# Patient Record
Sex: Female | Born: 1970 | Race: White | Hispanic: No | Marital: Married | Smoking: Never smoker
Health system: Southern US, Community
[De-identification: ages and names within clinical notes are randomized; demographics above are authoritative.]

## PROBLEM LIST (undated history)

## (undated) DIAGNOSIS — Z452 Encounter for adjustment and management of vascular access device: Secondary | ICD-10-CM

## (undated) HISTORY — DX: Encounter for adjustment and management of vascular access device: Z45.2

---

## 2014-06-28 HISTORY — PX: HEMILAMINOTOMY LUMBAR SPINE: SUR654

## 2014-07-13 DIAGNOSIS — M5126 Other intervertebral disc displacement, lumbar region: Secondary | ICD-10-CM

## 2014-07-13 HISTORY — DX: Other intervertebral disc displacement, lumbar region: M51.26

## 2015-06-04 DIAGNOSIS — R748 Abnormal levels of other serum enzymes: Secondary | ICD-10-CM

## 2015-06-04 DIAGNOSIS — E119 Type 2 diabetes mellitus without complications: Secondary | ICD-10-CM

## 2015-06-04 DIAGNOSIS — E1140 Type 2 diabetes mellitus with diabetic neuropathy, unspecified: Secondary | ICD-10-CM | POA: Insufficient documentation

## 2015-06-04 DIAGNOSIS — E1165 Type 2 diabetes mellitus with hyperglycemia: Secondary | ICD-10-CM | POA: Insufficient documentation

## 2015-06-04 DIAGNOSIS — IMO0002 Reserved for concepts with insufficient information to code with codable children: Secondary | ICD-10-CM

## 2015-06-04 HISTORY — DX: Type 2 diabetes mellitus without complications: E11.9

## 2015-06-04 HISTORY — DX: Abnormal levels of other serum enzymes: R74.8

## 2015-06-04 HISTORY — DX: Reserved for concepts with insufficient information to code with codable children: IMO0002

## 2015-06-12 DIAGNOSIS — R600 Localized edema: Secondary | ICD-10-CM

## 2015-06-12 DIAGNOSIS — E8809 Other disorders of plasma-protein metabolism, not elsewhere classified: Secondary | ICD-10-CM

## 2015-06-12 DIAGNOSIS — M7120 Synovial cyst of popliteal space [Baker], unspecified knee: Secondary | ICD-10-CM

## 2015-06-12 HISTORY — DX: Other disorders of plasma-protein metabolism, not elsewhere classified: E88.09

## 2015-06-12 HISTORY — DX: Synovial cyst of popliteal space (Baker), unspecified knee: M71.20

## 2015-06-12 HISTORY — DX: Localized edema: R60.0

## 2015-06-24 DIAGNOSIS — I480 Paroxysmal atrial fibrillation: Secondary | ICD-10-CM

## 2015-06-24 DIAGNOSIS — Z79899 Other long term (current) drug therapy: Secondary | ICD-10-CM

## 2015-06-24 HISTORY — DX: Other long term (current) drug therapy: Z79.899

## 2015-06-24 HISTORY — DX: Paroxysmal atrial fibrillation: I48.0

## 2015-12-20 DIAGNOSIS — A6920 Lyme disease, unspecified: Secondary | ICD-10-CM

## 2015-12-20 HISTORY — DX: Lyme disease, unspecified: A69.20

## 2016-02-18 DIAGNOSIS — M5441 Lumbago with sciatica, right side: Secondary | ICD-10-CM

## 2016-02-18 DIAGNOSIS — M4626 Osteomyelitis of vertebra, lumbar region: Secondary | ICD-10-CM

## 2016-02-18 DIAGNOSIS — M4620 Osteomyelitis of vertebra, site unspecified: Secondary | ICD-10-CM

## 2016-02-18 DIAGNOSIS — G8929 Other chronic pain: Secondary | ICD-10-CM

## 2016-02-18 DIAGNOSIS — M4800 Spinal stenosis, site unspecified: Secondary | ICD-10-CM

## 2016-02-18 DIAGNOSIS — M4646 Discitis, unspecified, lumbar region: Secondary | ICD-10-CM

## 2016-02-18 HISTORY — DX: Spinal stenosis, site unspecified: M48.00

## 2016-02-18 HISTORY — DX: Osteomyelitis of vertebra, lumbar region: M46.26

## 2016-02-18 HISTORY — DX: Discitis, unspecified, lumbar region: M46.46

## 2016-02-18 HISTORY — DX: Other chronic pain: G89.29

## 2016-02-18 HISTORY — DX: Osteomyelitis of vertebra, site unspecified: M46.20

## 2016-03-12 ENCOUNTER — Ambulatory Visit: Payer: Self-pay | Admitting: Infectious Diseases

## 2016-03-21 DIAGNOSIS — D649 Anemia, unspecified: Secondary | ICD-10-CM | POA: Insufficient documentation

## 2016-03-21 DIAGNOSIS — N179 Acute kidney failure, unspecified: Secondary | ICD-10-CM

## 2016-03-21 DIAGNOSIS — I10 Essential (primary) hypertension: Secondary | ICD-10-CM

## 2016-03-21 DIAGNOSIS — N189 Chronic kidney disease, unspecified: Secondary | ICD-10-CM

## 2016-03-21 DIAGNOSIS — K219 Gastro-esophageal reflux disease without esophagitis: Secondary | ICD-10-CM

## 2016-03-21 HISTORY — DX: Essential (primary) hypertension: I10

## 2016-03-21 HISTORY — DX: Gastro-esophageal reflux disease without esophagitis: K21.9

## 2016-03-21 HISTORY — DX: Acute kidney failure, unspecified: N18.9

## 2016-03-21 HISTORY — DX: Acute kidney failure, unspecified: N17.9

## 2016-03-21 HISTORY — DX: Anemia, unspecified: D64.9

## 2016-08-27 ENCOUNTER — Ambulatory Visit: Payer: Medicare Other | Admitting: Sports Medicine

## 2016-09-04 ENCOUNTER — Ambulatory Visit (INDEPENDENT_AMBULATORY_CARE_PROVIDER_SITE_OTHER): Payer: Medicare Other | Admitting: Sports Medicine

## 2016-09-04 ENCOUNTER — Encounter: Payer: Self-pay | Admitting: Sports Medicine

## 2016-09-04 DIAGNOSIS — M2042 Other hammer toe(s) (acquired), left foot: Secondary | ICD-10-CM | POA: Diagnosis not present

## 2016-09-04 DIAGNOSIS — F40240 Claustrophobia: Secondary | ICD-10-CM

## 2016-09-04 DIAGNOSIS — E1142 Type 2 diabetes mellitus with diabetic polyneuropathy: Secondary | ICD-10-CM

## 2016-09-04 DIAGNOSIS — D72829 Elevated white blood cell count, unspecified: Secondary | ICD-10-CM | POA: Insufficient documentation

## 2016-09-04 DIAGNOSIS — Z89422 Acquired absence of other left toe(s): Secondary | ICD-10-CM | POA: Diagnosis not present

## 2016-09-04 DIAGNOSIS — E11621 Type 2 diabetes mellitus with foot ulcer: Secondary | ICD-10-CM | POA: Diagnosis not present

## 2016-09-04 DIAGNOSIS — L97421 Non-pressure chronic ulcer of left heel and midfoot limited to breakdown of skin: Secondary | ICD-10-CM | POA: Diagnosis not present

## 2016-09-04 DIAGNOSIS — K219 Gastro-esophageal reflux disease without esophagitis: Secondary | ICD-10-CM

## 2016-09-04 DIAGNOSIS — IMO0002 Reserved for concepts with insufficient information to code with codable children: Secondary | ICD-10-CM

## 2016-09-04 DIAGNOSIS — M2041 Other hammer toe(s) (acquired), right foot: Secondary | ICD-10-CM

## 2016-09-04 DIAGNOSIS — M869 Osteomyelitis, unspecified: Secondary | ICD-10-CM | POA: Insufficient documentation

## 2016-09-04 HISTORY — DX: Claustrophobia: F40.240

## 2016-09-04 HISTORY — DX: Osteomyelitis, unspecified: M86.9

## 2016-09-04 HISTORY — DX: Gastro-esophageal reflux disease without esophagitis: K21.9

## 2016-09-04 HISTORY — DX: Elevated white blood cell count, unspecified: D72.829

## 2016-09-04 NOTE — Progress Notes (Signed)
Subjective: Emma Patton is a 45 y.o. female patient seen in office for Diabetic shoes and for evaluation of ulceration of the Left foot; Patient reports that she is going to the wound care center once a week where they aren't doing the dressing changes and local wound care to her left foot. States that tomorrow and make her 4th week. Patient has a history of diabetes and a blood glucose level  today of 200 mg/dl. Denies nausea/fever/vomiting/chills/night sweats/shortness of breath/pain. Patient has no other pedal complaints at this time.  Patient Active Problem List   Diagnosis Date Noted  . Claustrophobia 09/04/2016  . Esophageal reflux 09/04/2016  . Leukocytosis 09/04/2016  . Osteomyelitis of toe of left foot ([place]) 09/04/2016  . AKI (acute kidney injury) ([place]) 03/21/2016  . Anemia 03/21/2016  . Gastroesophageal reflux disease 03/21/2016  . HTN (hypertension), benign 03/21/2016  . Chronic bilateral low back pain with right-sided sciatica 02/18/2016  . Discitis of lumbar region 02/18/2016  . 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 Osteomyelitis of spine ([place]) 02/18/2016  . Spinal stenosis 02/18/2016  . Lyme disease 12/20/2015  . On amiodarone therapy 06/24/2015  . PAF (paroxysmal atrial fibrillation) ([place]) 06/24/2015  . Baker's cyst of knee 06/12/2015  . Bilateral leg edema 06/12/2015  . Edema of lower extremity 06/12/2015  . Hypoalbuminemia 06/12/2015  . Diabetes mellitus ([place]) 06/04/2015  . DM type 2, uncontrolled, with neuropathy ([place]) 06/04/2015  . Elevated alkaline phosphatase level 06/04/2015  . 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   No current facility-administered medications on file prior to visit.    Allergies  Allergen Reactions  . Penicillins Rash and Hives    No results found for this or any previous visit (from the past 2160 hour(s)).  Objective: There were no vitals filed for this  visit.  General: Patient is awake, alert, oriented x 3 and in no acute distress.  Dermatology: Skin is warm and dry bilateral with a Partial thickness ulceration present plantar aspect of fifth metatarsal at base near styloid process. Ulceration measures 0.3 cm x 0.3 cm x 0.1 cm. There is a keratotic border with a granular base. The ulceration does not probe to bone. There is no malodor, no active drainage, no erythema, no edema. No acute signs of infection.   Vascular: Dorsalis Pedis pulse = 2/4 Bilateral,  Posterior Tibial pulse = 1/4 Bilateral,  Capillary Fill Time < 5 seconds  Neurologic: Protective sensation absent to the level of the ankles using the 5.07/10g [employer]. Vibratory absent bilateral.  Musculosketal: Amputation status of the left fourth and fifth toes with slight fixed varus deformity of the left foot.  No Pain with palpation to ulcerated area. No pain with compression to calves bilateral.   Assessment and Plan:  Problem List Items Addressed This Visit    None    Visit Diagnoses    Diabetic ulcer of left midfoot associated with type 2 diabetes mellitus, limited to breakdown of skin ([place])    -  Primary   plantar styloid process   Relevant Medications   sitaGLIPtin (JANUVIA) 100 MG tablet   Diabetic polyneuropathy associated with type 2 diabetes mellitus (HCC)       Relevant Medications   gabapentin (NEURONTIN) 600 MG tablet   sitaGLIPtin (JANUVIA) 100 MG tablet   pregabalin (LYRICA) 75 MG capsule   traZODone (DESYREL) 100 MG tablet   zolpidem (AMBIEN) 10 MG tablet   Hammer toes  of both feet       Toe amputation status, left (HCC)       4-5 toes     -Examined patient and discussed the progression of the wound and treatment alternatives. -Patient to continue with local wound care with wound care center once a week -Xrays reviewedFrom [place]. Negative for osteomyelitis -Today, cleansed ulceration with wound wash and applied dry sterile dressing  and instructed patient to continue with wound care center Follow-up. - Advised patient to go to the ER or return to office if the wound worsens or if constitutional symptoms are present. -[employer] diabetic shoe order form was completed; foam box compression of both feet were obtained Recommend patient to get custom offloading inserts with lesser toe fillers patient is missing the left fourth and fifth toes: office to contact primary care for approval / certification;  Office to arrange shoe fitting and dispensing. -Patient to return to office for pick up diabetic shoes and evaluation or sooner if problems arise.  Landis Martins, DPM

## 2016-09-08 DIAGNOSIS — M5136 Other intervertebral disc degeneration, lumbar region: Secondary | ICD-10-CM

## 2016-09-08 DIAGNOSIS — M51369 Other intervertebral disc degeneration, lumbar region without mention of lumbar back pain or lower extremity pain: Secondary | ICD-10-CM

## 2016-09-08 HISTORY — DX: Other intervertebral disc degeneration, lumbar region: M51.36

## 2016-09-08 HISTORY — DX: Other intervertebral disc degeneration, lumbar region without mention of lumbar back pain or lower extremity pain: M51.369

## 2016-12-14 DIAGNOSIS — M86172 Other acute osteomyelitis, left ankle and foot: Secondary | ICD-10-CM

## 2016-12-14 DIAGNOSIS — E1351 Other specified diabetes mellitus with diabetic peripheral angiopathy without gangrene: Secondary | ICD-10-CM

## 2016-12-14 DIAGNOSIS — D509 Iron deficiency anemia, unspecified: Secondary | ICD-10-CM | POA: Diagnosis not present

## 2016-12-14 DIAGNOSIS — E1142 Type 2 diabetes mellitus with diabetic polyneuropathy: Secondary | ICD-10-CM | POA: Diagnosis not present

## 2016-12-14 DIAGNOSIS — L03116 Cellulitis of left lower limb: Secondary | ICD-10-CM

## 2016-12-15 DIAGNOSIS — L089 Local infection of the skin and subcutaneous tissue, unspecified: Secondary | ICD-10-CM | POA: Diagnosis not present

## 2016-12-15 DIAGNOSIS — E1142 Type 2 diabetes mellitus with diabetic polyneuropathy: Secondary | ICD-10-CM | POA: Diagnosis not present

## 2016-12-15 DIAGNOSIS — L0291 Cutaneous abscess, unspecified: Secondary | ICD-10-CM

## 2016-12-15 DIAGNOSIS — E1351 Other specified diabetes mellitus with diabetic peripheral angiopathy without gangrene: Secondary | ICD-10-CM | POA: Diagnosis not present

## 2016-12-15 DIAGNOSIS — M86172 Other acute osteomyelitis, left ankle and foot: Secondary | ICD-10-CM | POA: Diagnosis not present

## 2016-12-15 DIAGNOSIS — L03116 Cellulitis of left lower limb: Secondary | ICD-10-CM | POA: Diagnosis not present

## 2016-12-16 ENCOUNTER — Encounter: Payer: Self-pay | Admitting: Sports Medicine

## 2016-12-16 DIAGNOSIS — L03116 Cellulitis of left lower limb: Secondary | ICD-10-CM | POA: Diagnosis not present

## 2016-12-16 DIAGNOSIS — E1142 Type 2 diabetes mellitus with diabetic polyneuropathy: Secondary | ICD-10-CM | POA: Diagnosis not present

## 2016-12-16 DIAGNOSIS — M86172 Other acute osteomyelitis, left ankle and foot: Secondary | ICD-10-CM | POA: Diagnosis not present

## 2016-12-16 DIAGNOSIS — E1351 Other specified diabetes mellitus with diabetic peripheral angiopathy without gangrene: Secondary | ICD-10-CM | POA: Diagnosis not present

## 2016-12-17 DIAGNOSIS — N289 Disorder of kidney and ureter, unspecified: Secondary | ICD-10-CM

## 2016-12-17 DIAGNOSIS — L03116 Cellulitis of left lower limb: Secondary | ICD-10-CM

## 2016-12-17 DIAGNOSIS — E1351 Other specified diabetes mellitus with diabetic peripheral angiopathy without gangrene: Secondary | ICD-10-CM

## 2016-12-17 DIAGNOSIS — D509 Iron deficiency anemia, unspecified: Secondary | ICD-10-CM | POA: Diagnosis not present

## 2016-12-17 DIAGNOSIS — M86172 Other acute osteomyelitis, left ankle and foot: Secondary | ICD-10-CM

## 2016-12-17 DIAGNOSIS — I10 Essential (primary) hypertension: Secondary | ICD-10-CM | POA: Diagnosis not present

## 2016-12-17 DIAGNOSIS — E1142 Type 2 diabetes mellitus with diabetic polyneuropathy: Secondary | ICD-10-CM | POA: Diagnosis not present

## 2016-12-17 DIAGNOSIS — L0291 Cutaneous abscess, unspecified: Secondary | ICD-10-CM

## 2016-12-18 DIAGNOSIS — I10 Essential (primary) hypertension: Secondary | ICD-10-CM | POA: Diagnosis not present

## 2016-12-18 DIAGNOSIS — L0291 Cutaneous abscess, unspecified: Secondary | ICD-10-CM | POA: Diagnosis not present

## 2016-12-18 DIAGNOSIS — M86172 Other acute osteomyelitis, left ankle and foot: Secondary | ICD-10-CM | POA: Diagnosis not present

## 2016-12-18 DIAGNOSIS — N289 Disorder of kidney and ureter, unspecified: Secondary | ICD-10-CM | POA: Diagnosis not present

## 2016-12-18 DIAGNOSIS — L03116 Cellulitis of left lower limb: Secondary | ICD-10-CM | POA: Diagnosis not present

## 2016-12-19 DIAGNOSIS — N289 Disorder of kidney and ureter, unspecified: Secondary | ICD-10-CM | POA: Diagnosis not present

## 2016-12-19 DIAGNOSIS — I10 Essential (primary) hypertension: Secondary | ICD-10-CM | POA: Diagnosis not present

## 2016-12-19 DIAGNOSIS — M86172 Other acute osteomyelitis, left ankle and foot: Secondary | ICD-10-CM | POA: Diagnosis not present

## 2016-12-19 DIAGNOSIS — L03116 Cellulitis of left lower limb: Secondary | ICD-10-CM | POA: Diagnosis not present

## 2016-12-30 ENCOUNTER — Encounter: Payer: Self-pay | Admitting: Sports Medicine

## 2017-01-01 ENCOUNTER — Encounter: Payer: Self-pay | Admitting: Sports Medicine

## 2017-01-01 ENCOUNTER — Ambulatory Visit (INDEPENDENT_AMBULATORY_CARE_PROVIDER_SITE_OTHER): Payer: Self-pay | Admitting: Sports Medicine

## 2017-01-01 VITALS — BP 154/71 | HR 65 | Temp 97.3°F | Resp 18

## 2017-01-01 DIAGNOSIS — E1142 Type 2 diabetes mellitus with diabetic polyneuropathy: Secondary | ICD-10-CM

## 2017-01-01 DIAGNOSIS — Z9889 Other specified postprocedural states: Secondary | ICD-10-CM

## 2017-01-01 DIAGNOSIS — E11621 Type 2 diabetes mellitus with foot ulcer: Secondary | ICD-10-CM

## 2017-01-01 DIAGNOSIS — IMO0002 Reserved for concepts with insufficient information to code with codable children: Secondary | ICD-10-CM

## 2017-01-01 DIAGNOSIS — Z89422 Acquired absence of other left toe(s): Secondary | ICD-10-CM

## 2017-01-01 DIAGNOSIS — L97421 Non-pressure chronic ulcer of left heel and midfoot limited to breakdown of skin: Secondary | ICD-10-CM

## 2017-01-01 NOTE — Progress Notes (Signed)
Subjective: Emma Patton is a 46 y.o. female patient seen today in office for POV #1 (DOS 12/15/2016), S/P left foot incision and drainage with removal of bone at amputation stump site. Patient reports that she is getting PICC line antibiotics every day and has several weeks left also reports that she is going to the wound care center and they have been applying Aqua-Seal AG to the wound. Patient denies pain at surgical site, denies calf pain, denies headache, chest pain, shortness of breath, nausea, vomiting, fever, or chills. Patient states that she is feeling better and at the wound center. They may try hyperbaric to help with healing. No other issues noted.   Patient Active Problem List   Diagnosis Date Noted  . Claustrophobia 09/04/2016  . Esophageal reflux 09/04/2016  . Leukocytosis 09/04/2016  . Osteomyelitis of toe of left foot ([place]) 09/04/2016  . AKI (acute kidney injury) ([place]) 03/21/2016  . Anemia 03/21/2016  . Gastroesophageal reflux disease 03/21/2016  . HTN (hypertension), benign 03/21/2016  . Chronic bilateral low back pain with right-sided sciatica 02/18/2016  . Discitis of lumbar region 02/18/2016  . 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 Osteomyelitis of spine ([place]) 02/18/2016  . Spinal stenosis 02/18/2016  . Lyme disease 12/20/2015  . On amiodarone therapy 06/24/2015  . PAF (paroxysmal atrial fibrillation) ([place]) 06/24/2015  . Baker's cyst of knee 06/12/2015  . Bilateral leg edema 06/12/2015  . Edema of lower extremity 06/12/2015  . Hypoalbuminemia 06/12/2015  . Diabetes mellitus ([place]) 06/04/2015  . DM type 2, uncontrolled, with neuropathy ([place]) 06/04/2015  . Elevated alkaline phosphatase level 06/04/2015  . Lumbar disc herniation 07/13/2014    Current Outpatient Prescriptions on File Prior to Visit  Medication Sig Dispense Refill  . amLODipine (NORVASC) 5 MG tablet Take 5 mg by mouth daily.    . furosemide (LASIX) 40 MG tablet Take 40 mg by  mouth.    . gabapentin (NEURONTIN) 600 MG tablet Take 600 mg by mouth.    . linaclotide (LINZESS) 145 MCG CAPS capsule Take by mouth.    . metoCLOPramide (REGLAN) 10 MG tablet Take 10 mg by mouth.    Marland Kitchen omeprazole (PRILOSEC) 20 MG capsule Take 20 mg by mouth.    . oxyCODONE-acetaminophen (PERCOCET) 10-325 MG tablet     . pregabalin (LYRICA) 75 MG capsule Take 75 mg by mouth.    . sitaGLIPtin (JANUVIA) 100 MG tablet Take 100 mg by mouth.    . traZODone (DESYREL) 100 MG tablet Take 100 mg by mouth.    . zolpidem (AMBIEN) 10 MG tablet Take 10 mg by mouth.     No current facility-administered medications on file prior to visit.     Allergies  Allergen Reactions  . Penicillins Rash and Hives    Objective: There were no vitals filed for this visit.  General: No acute distress, AAOx3  Left foot: Sutures intact with no gapping or dehiscence at surgical site, Full thickness ulcer, plantar lateral left foot measuring less than 2 cm and a partial thickness ulcer, dorsal lateral left foot measuring less than 1 cm with surrounding superficial blistering that is improving in nature that extends to the suture line on the dorsal lateral aspect of the left foot, no erythema, no warmth, no active drainage, no signs of infection noted, Capillary fill time <3 seconds in remaining all digits, gross sensation present via light touch to eft foot. No pain or crepitation with range of motion left foot.  Varus foot deformity status  post toe amputation previously left foot. No pain with calf compression.    Assessment and Plan:  Problem List Items Addressed This Visit    None    Visit Diagnoses    S/P foot surgery, left    -  Primary   Diabetic ulcer of left midfoot associated with type 2 diabetes mellitus, limited to breakdown of skin ([place])       Diabetic polyneuropathy associated with type 2 diabetes mellitus ([place])       Toe amputation status, left ([place])           -Patient seen and evaluated -Applied  dry sterile dressing to surgical site left foot secured with ACE wrap and stockinet  -Advised patient to make sure to keep dressings clean, dry, and intact to leftFoot with weekly wound care visits to wound care center -Advised patient to continue with post-op shoe on left foot -Continue PICC line antibiotics until completed -Advised patient to limit activity to necessity  -Advised patient to ice and elevate as necessary  -Will plan for suture removal at next office visit. In the meantime, patient to call office if any issues or problems arise.   Landis Martins, DPM

## 2017-01-07 ENCOUNTER — Ambulatory Visit (INDEPENDENT_AMBULATORY_CARE_PROVIDER_SITE_OTHER): Payer: Self-pay | Admitting: Sports Medicine

## 2017-01-07 ENCOUNTER — Encounter: Payer: Self-pay | Admitting: Sports Medicine

## 2017-01-07 DIAGNOSIS — L97421 Non-pressure chronic ulcer of left heel and midfoot limited to breakdown of skin: Secondary | ICD-10-CM

## 2017-01-07 DIAGNOSIS — E1142 Type 2 diabetes mellitus with diabetic polyneuropathy: Secondary | ICD-10-CM

## 2017-01-07 DIAGNOSIS — Z89422 Acquired absence of other left toe(s): Secondary | ICD-10-CM

## 2017-01-07 DIAGNOSIS — IMO0002 Reserved for concepts with insufficient information to code with codable children: Secondary | ICD-10-CM

## 2017-01-07 DIAGNOSIS — E11621 Type 2 diabetes mellitus with foot ulcer: Secondary | ICD-10-CM

## 2017-01-07 DIAGNOSIS — Z9889 Other specified postprocedural states: Secondary | ICD-10-CM

## 2017-01-07 NOTE — Progress Notes (Signed)
Subjective: Emma Patton is a 46 y.o. female patient seen today in office for POV #2 (DOS 12/15/2016), S/P left foot incision and drainage with removal of bone at amputation stump site. Patient reports that she is getting PICC line antibiotics every day and will finish Jan 31, 2017. Reports that she is going to the wound care center and they have been applying Aqua-Seal AG to the wound and may put her in Hyperbaric chamber. Admit had CT with possible osteomyelitis. Patient denies pain at surgical site, denies calf pain, denies headache, chest pain, shortness of breath, nausea, vomiting, fever, or chills. No other issues noted.   Patient Active Problem List   Diagnosis Date Noted  . Claustrophobia 09/04/2016  . Esophageal reflux 09/04/2016  . Leukocytosis 09/04/2016  . Osteomyelitis of toe of left foot ([place]) 09/04/2016  . AKI (acute kidney injury) ([place]) 03/21/2016  . Anemia 03/21/2016  . Gastroesophageal reflux disease 03/21/2016  . HTN (hypertension), benign 03/21/2016  . Chronic bilateral low back pain with right-sided sciatica 02/18/2016  . Discitis of lumbar region 02/18/2016  . 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 Osteomyelitis of spine ([place]) 02/18/2016  . Spinal stenosis 02/18/2016  . Lyme disease 12/20/2015  . On amiodarone therapy 06/24/2015  . PAF (paroxysmal atrial fibrillation) ([place]) 06/24/2015  . Baker's cyst of knee 06/12/2015  . Bilateral leg edema 06/12/2015  . Edema of lower extremity 06/12/2015  . Hypoalbuminemia 06/12/2015  . Diabetes mellitus ([place]) 06/04/2015  . DM type 2, uncontrolled, with neuropathy ([place]) 06/04/2015  . Elevated alkaline phosphatase level 06/04/2015  . Lumbar disc herniation 07/13/2014    Current Outpatient Prescriptions on File Prior to Visit  Medication Sig Dispense Refill  . amLODipine (NORVASC) 5 MG tablet Take 5 mg by mouth daily.    . furosemide (LASIX) 40 MG tablet Take 40 mg by mouth.    . gabapentin (NEURONTIN) 600  MG tablet Take 600 mg by mouth.    . linaclotide (LINZESS) 145 MCG CAPS capsule Take by mouth.    . metoCLOPramide (REGLAN) 10 MG tablet Take 10 mg by mouth.    Marland Kitchen omeprazole (PRILOSEC) 20 MG capsule Take 20 mg by mouth.    . oxyCODONE-acetaminophen (PERCOCET) 10-325 MG tablet     . pregabalin (LYRICA) 75 MG capsule Take 75 mg by mouth.    . sitaGLIPtin (JANUVIA) 100 MG tablet Take 100 mg by mouth.    . traZODone (DESYREL) 100 MG tablet Take 100 mg by mouth.    . zolpidem (AMBIEN) 10 MG tablet Take 10 mg by mouth.     No current facility-administered medications on file prior to visit.     Allergies  Allergen Reactions  . Penicillins Rash and Hives    Objective: There were no vitals filed for this visit.  General: No acute distress, AAOx3  Left foot: Sutures intact with no gapping or dehiscence at surgical site, Full thickness ulcer, plantar lateral left foot measuring less than 2 cm and a partial thickness ulcer, dorsal lateral left foot measuring less than 1 cm with surrounding superficial blistering that is improving in nature that extends to the suture line on the dorsal lateral aspect of the left foot, no erythema, no warmth, no active drainage, no signs of infection noted, Capillary fill time <3 seconds in remaining all digits, gross sensation present via light touch to eft foot. No pain or crepitation with range of motion left foot.  Varus foot deformity status post toe amputation previously left foot. No  pain with calf compression.    Assessment and Plan:  Problem List Items Addressed This Visit    None    Visit Diagnoses    S/P foot surgery, left    -  Primary   Diabetic ulcer of left midfoot associated with type 2 diabetes mellitus, limited to breakdown of skin ([place])       Diabetic polyneuropathy associated with type 2 diabetes mellitus ([place])       Toe amputation status, left ([place])           -Patient seen and evaluated -Sutures removed -Applied dry sterile dressing to  surgical site left foot secured with ACE wrap and stockinet  -Advised patient to make sure to keep dressings clean, dry, and intact to left foot with weekly wound care visits to wound care center -Advised patient to continue with post-op shoe on left foot -Continue PICC line antibiotics until completed (Jan 31, 2017) -Advised patient to limit activity to necessity  -Advised patient to ice and elevate as necessary  -Will plan for final wound check and deferring patient for continued wound care by wound center. In the meantime, patient to call office if any issues or problems arise.   Landis Martins, DPM

## 2017-01-21 ENCOUNTER — Ambulatory Visit (INDEPENDENT_AMBULATORY_CARE_PROVIDER_SITE_OTHER): Payer: Medicare Other | Admitting: Sports Medicine

## 2017-01-21 ENCOUNTER — Encounter: Payer: Self-pay | Admitting: Sports Medicine

## 2017-01-21 DIAGNOSIS — E1142 Type 2 diabetes mellitus with diabetic polyneuropathy: Secondary | ICD-10-CM

## 2017-01-21 DIAGNOSIS — M2041 Other hammer toe(s) (acquired), right foot: Secondary | ICD-10-CM

## 2017-01-21 DIAGNOSIS — Z9889 Other specified postprocedural states: Secondary | ICD-10-CM

## 2017-01-21 DIAGNOSIS — Z89422 Acquired absence of other left toe(s): Secondary | ICD-10-CM

## 2017-01-21 DIAGNOSIS — E11621 Type 2 diabetes mellitus with foot ulcer: Secondary | ICD-10-CM

## 2017-01-21 DIAGNOSIS — L97421 Non-pressure chronic ulcer of left heel and midfoot limited to breakdown of skin: Secondary | ICD-10-CM

## 2017-01-21 DIAGNOSIS — IMO0002 Reserved for concepts with insufficient information to code with codable children: Secondary | ICD-10-CM

## 2017-01-21 DIAGNOSIS — M2042 Other hammer toe(s) (acquired), left foot: Secondary | ICD-10-CM

## 2017-01-21 NOTE — Progress Notes (Signed)
Subjective: Emma Patton is a 46 y.o. female patient seen today in office for POV #3 (DOS 12/15/2016), S/P left foot incision and drainage with removal of bone at amputation stump site. Patient reports that she is getting PICC line antibiotics every day and will finish Jan 31, 2017. Reports that she is going to the wound care center and they have been applying Aqua-Seal AG to the wound; with appt today. Patient denies pain at surgical site, denies calf pain, denies headache, chest pain, shortness of breath, nausea, vomiting, fever, or chills. No other issues noted.   Patient Active Problem List   Diagnosis Date Noted  . Claustrophobia 09/04/2016  . Esophageal reflux 09/04/2016  . Leukocytosis 09/04/2016  . Osteomyelitis of toe of left foot ([place]) 09/04/2016  . AKI (acute kidney injury) ([place]) 03/21/2016  . Anemia 03/21/2016  . Gastroesophageal reflux disease 03/21/2016  . HTN (hypertension), benign 03/21/2016  . Chronic bilateral low back pain with right-sided sciatica 02/18/2016  . Discitis of lumbar region 02/18/2016  . 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 Osteomyelitis of spine ([place]) 02/18/2016  . Spinal stenosis 02/18/2016  . Lyme disease 12/20/2015  . On amiodarone therapy 06/24/2015  . PAF (paroxysmal atrial fibrillation) ([place]) 06/24/2015  . Baker's cyst of knee 06/12/2015  . Bilateral leg edema 06/12/2015  . Edema of lower extremity 06/12/2015  . Hypoalbuminemia 06/12/2015  . Diabetes mellitus ([place]) 06/04/2015  . DM type 2, uncontrolled, with neuropathy ([place]) 06/04/2015  . Elevated alkaline phosphatase level 06/04/2015  . Lumbar disc herniation 07/13/2014    Current Outpatient Prescriptions on File Prior to Visit  Medication Sig Dispense Refill  . amLODipine (NORVASC) 5 MG tablet Take 5 mg by mouth daily.    . furosemide (LASIX) 40 MG tablet Take 40 mg by mouth.    . gabapentin (NEURONTIN) 600 MG tablet Take 600 mg by mouth.    . linaclotide (LINZESS) 145  MCG CAPS capsule Take by mouth.    . metoCLOPramide (REGLAN) 10 MG tablet Take 10 mg by mouth.    Marland Kitchen omeprazole (PRILOSEC) 20 MG capsule Take 20 mg by mouth.    . oxyCODONE-acetaminophen (PERCOCET) 10-325 MG tablet     . pregabalin (LYRICA) 75 MG capsule Take 75 mg by mouth.    . sitaGLIPtin (JANUVIA) 100 MG tablet Take 100 mg by mouth.    . traZODone (DESYREL) 100 MG tablet Take 100 mg by mouth.    . zolpidem (AMBIEN) 10 MG tablet Take 10 mg by mouth.     No current facility-administered medications on file prior to visit.     Allergies  Allergen Reactions  . Penicillins Rash and Hives    Objective: There were no vitals filed for this visit.  General: No acute distress, AAOx3  Left foot: There is a now partial thickness ulcer, plantar lateral left foot measuring 1x0.4cm and a partial thickness ulcer, dorsal lateral left foot measuring less than 0.3x0.2cm with granular bases and no erythema, no warmth, no active drainage, no signs of infection noted, Capillary fill time <3 seconds in remaining all digits, gross sensation present via light touch to eft foot. No pain or crepitation with range of motion left foot.  Varus foot deformity status post toe amputation previously left foot. No pain with calf compression.    Assessment and Plan:  Problem List Items Addressed This Visit    None    Visit Diagnoses    S/P foot surgery, left    -  Primary  Diabetic ulcer of left midfoot associated with type 2 diabetes mellitus, limited to breakdown of skin (HCC)       Diabetic polyneuropathy associated with type 2 diabetes mellitus (HCC)       Toe amputation status, left (HCC)       Hammer toes of both feet           -Patient seen and evaluated -Applied dry sterile dressing to surgical site left foot secured with [place] and stockinet  -Advised patient to make sure to keep dressings clean, dry, and intact to left foot with weekly wound care visits to wound care center -Advised patient to  continue with post-op shoe on left foot -Continue PICC line antibiotics until completed (Jan 31, 2017) -Advised patient to limit activity to necessity  -Advised patient to ice and elevate as necessary  -Will plan for final wound check and deferring patient for continued wound care by wound center in 1 month. Also resubmitted Diabetic shoe paperwork for patient to get Dr. Jannette Fogo not NP to sign it since her last paperwork from December has expired. In the meantime, patient to call office if any issues or problems arise.   Landis Martins, DPM

## 2017-01-29 ENCOUNTER — Telehealth: Payer: Self-pay | Admitting: *Deleted

## 2017-01-29 NOTE — Telephone Encounter (Addendum)
Emma Patton - [facility] states pt's last dose of IV Vancomycin is tomorrow, and she would like to know if PICC was to remain in place another week or to be pulled. 01/30/2017-I informed Edd Fabian [facility] of Dr. Leeanne Rio 01/29/2017 3:44pm orders for PICC line. 02/05/2017-Gayle March [facility] states pt was to see Wound Care, but they released her last week, foot is healed and she asked if she was to remove the PICC line. 02/06/2017-I informed Emma Patton - [facility] of Dr. Leeanne Rio 02/05/2017 5:53pm orders to remove PICC line and remind pt to keep her 02/20/2017 appt. Edd Fabian states understanding.

## 2017-01-29 NOTE — Telephone Encounter (Signed)
The PICC line can be removed next week. Have patient to continue with her appointments with wound care center and if the wound is continuing to look good can remove next week. Thanks Dr. Cannon Kettle

## 2017-02-05 NOTE — Telephone Encounter (Signed)
Nursing can remove the PICC line. Patient can keep her appt for 5-25 for a final diabetic foot check Dr. Cannon Kettle

## 2017-02-20 ENCOUNTER — Encounter: Payer: Medicare Other | Admitting: Sports Medicine

## 2017-03-06 ENCOUNTER — Ambulatory Visit (INDEPENDENT_AMBULATORY_CARE_PROVIDER_SITE_OTHER): Payer: Self-pay | Admitting: Sports Medicine

## 2017-03-06 DIAGNOSIS — IMO0002 Reserved for concepts with insufficient information to code with codable children: Secondary | ICD-10-CM

## 2017-03-06 DIAGNOSIS — Z9889 Other specified postprocedural states: Secondary | ICD-10-CM

## 2017-03-06 DIAGNOSIS — E1142 Type 2 diabetes mellitus with diabetic polyneuropathy: Secondary | ICD-10-CM

## 2017-03-06 DIAGNOSIS — L97421 Non-pressure chronic ulcer of left heel and midfoot limited to breakdown of skin: Secondary | ICD-10-CM

## 2017-03-06 DIAGNOSIS — Z89422 Acquired absence of other left toe(s): Secondary | ICD-10-CM

## 2017-03-06 DIAGNOSIS — E11621 Type 2 diabetes mellitus with foot ulcer: Secondary | ICD-10-CM

## 2017-03-06 NOTE — Progress Notes (Signed)
Subjective: Emma Patton is a 46 y.o. female patient seen today in office for POV #4 (DOS 12/15/2016), S/P left foot incision and drainage with removal of bone at amputation stump site. Patient reports that she is doing well. Areas are healed and is in normal tennis shoe. Patient denies pain at  healed surgical site, denies calf pain, denies headache, chest pain, shortness of breath, nausea, vomiting, fever, or chills. No other issues noted.   Patient Active Problem List   Diagnosis Date Noted  . Claustrophobia 09/04/2016  . Esophageal reflux 09/04/2016  . Leukocytosis 09/04/2016  . Osteomyelitis of toe of left foot ([place]) 09/04/2016  . AKI (acute kidney injury) ([place]) 03/21/2016  . Anemia 03/21/2016  . Gastroesophageal reflux disease 03/21/2016  . HTN (hypertension), benign 03/21/2016  . Chronic bilateral low back pain with right-sided sciatica 02/18/2016  . Discitis of lumbar region 02/18/2016  . 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 Osteomyelitis of spine ([place]) 02/18/2016  . Spinal stenosis 02/18/2016  . Lyme disease 12/20/2015  . On amiodarone therapy 06/24/2015  . PAF (paroxysmal atrial fibrillation) ([place]) 06/24/2015  . Baker's cyst of knee 06/12/2015  . Bilateral leg edema 06/12/2015  . Edema of lower extremity 06/12/2015  . Hypoalbuminemia 06/12/2015  . Diabetes mellitus ([place]) 06/04/2015  . DM type 2, uncontrolled, with neuropathy ([place]) 06/04/2015  . Elevated alkaline phosphatase level 06/04/2015  . Lumbar disc herniation 07/13/2014    Current Outpatient Prescriptions on File Prior to Visit  Medication Sig Dispense Refill  . amLODipine (NORVASC) 5 MG tablet Take 5 mg by mouth daily.    . furosemide (LASIX) 40 MG tablet Take 40 mg by mouth.    . gabapentin (NEURONTIN) 600 MG tablet Take 600 mg by mouth.    . linaclotide (LINZESS) 145 MCG CAPS capsule Take by mouth.    . metoCLOPramide (REGLAN) 10 MG tablet Take 10 mg by mouth.    Marland Kitchen omeprazole (PRILOSEC)  20 MG capsule Take 20 mg by mouth.    . oxyCODONE-acetaminophen (PERCOCET) 10-325 MG tablet     . pregabalin (LYRICA) 75 MG capsule Take 75 mg by mouth.    . sitaGLIPtin (JANUVIA) 100 MG tablet Take 100 mg by mouth.    . traZODone (DESYREL) 100 MG tablet Take 100 mg by mouth.    . zolpidem (AMBIEN) 10 MG tablet Take 10 mg by mouth.     No current facility-administered medications on file prior to visit.     Allergies  Allergen Reactions  . Penicillins Rash and Hives    Objective: There were no vitals filed for this visit.  General: No acute distress, AAOx3  Left foot: Ulcerations healed, no erythema, no warmth, no active drainage, no signs of infection noted, Capillary fill time <3 seconds in remaining all digits, gross sensation present via light touch to eft foot. No pain or crepitation with range of motion left foot.  Varus foot deformity status post toe amputation previously on left foot. No pain with calf compression.    Assessment and Plan:  Problem List Items Addressed This Visit    None    Visit Diagnoses    S/P foot surgery, left    -  Primary   Diabetic ulcer of left midfoot associated with type 2 diabetes mellitus, limited to breakdown of skin ([place])       healed   Diabetic polyneuropathy associated with type 2 diabetes mellitus ([place])       Toe amputation status, left ([place])           -  Patient seen and evaluated -Area well healed -Patient awaiting diabetic shoes; patient to return on Wednesday for Diabetic shoe fitting.   Landis Martins, DPM

## 2017-03-11 ENCOUNTER — Other Ambulatory Visit: Payer: Medicare Other

## 2017-03-27 ENCOUNTER — Ambulatory Visit: Payer: Medicare Other

## 2017-04-03 ENCOUNTER — Ambulatory Visit: Payer: Medicare Other | Admitting: *Deleted

## 2017-04-17 ENCOUNTER — Other Ambulatory Visit: Payer: Medicare Other

## 2017-05-07 ENCOUNTER — Ambulatory Visit (INDEPENDENT_AMBULATORY_CARE_PROVIDER_SITE_OTHER): Payer: Medicare Other | Admitting: Sports Medicine

## 2017-05-07 DIAGNOSIS — E1142 Type 2 diabetes mellitus with diabetic polyneuropathy: Secondary | ICD-10-CM

## 2017-05-07 DIAGNOSIS — IMO0002 Reserved for concepts with insufficient information to code with codable children: Secondary | ICD-10-CM

## 2017-05-07 DIAGNOSIS — Z89422 Acquired absence of other left toe(s): Secondary | ICD-10-CM

## 2017-05-07 DIAGNOSIS — L84 Corns and callosities: Secondary | ICD-10-CM

## 2017-05-07 DIAGNOSIS — M2042 Other hammer toe(s) (acquired), left foot: Secondary | ICD-10-CM

## 2017-05-07 DIAGNOSIS — M2041 Other hammer toe(s) (acquired), right foot: Secondary | ICD-10-CM

## 2017-05-07 NOTE — Patient Instructions (Signed)

## 2017-05-08 NOTE — Progress Notes (Signed)
Patient ID: Emma Patton, female   DOB: April 03, 1971, 46 y.o.   MRN: [id number]   Patient presents for diabetic shoe pick up, shoes are tried on for good fit.  Patient received 1 pair Men's New Balance 26 Lace white athletic in size 10.5 wide and 3 pairs custom molded diabetic inserts.  Verbal and written break in and wear instructions given.  Patient will follow up for scheduled routine care.

## 2017-05-08 NOTE — Progress Notes (Signed)
Patient discussed with medical assistant. Agree with below. Patient to follow up as scheduled for continued care or sooner if problems or issues arise. -Dr. Cannon Kettle

## 2019-12-19 ENCOUNTER — Other Ambulatory Visit: Payer: Self-pay

## 2019-12-19 ENCOUNTER — Inpatient Hospital Stay (HOSPITAL_COMMUNITY)
Admission: EM | Admit: 2019-12-19 | Discharge: 2019-12-26 | DRG: 917 | Disposition: A | Discharge status: Home Health | Payer: Medicare Other | Attending: Internal Medicine | Admitting: Internal Medicine

## 2019-12-19 ENCOUNTER — Emergency Department (HOSPITAL_COMMUNITY): Payer: Medicare Other

## 2019-12-19 DIAGNOSIS — E875 Hyperkalemia: Secondary | ICD-10-CM | POA: Diagnosis not present

## 2019-12-19 DIAGNOSIS — D649 Anemia, unspecified: Secondary | ICD-10-CM | POA: Diagnosis present

## 2019-12-19 DIAGNOSIS — Z6841 Body Mass Index (BMI) 40.0 and over, adult: Secondary | ICD-10-CM

## 2019-12-19 DIAGNOSIS — J9601 Acute respiratory failure with hypoxia: Secondary | ICD-10-CM | POA: Diagnosis present

## 2019-12-19 DIAGNOSIS — Z79891 Long term (current) use of opiate analgesic: Secondary | ICD-10-CM

## 2019-12-19 DIAGNOSIS — I130 Hypertensive heart and chronic kidney disease with heart failure and stage 1 through stage 4 chronic kidney disease, or unspecified chronic kidney disease: Secondary | ICD-10-CM | POA: Diagnosis present

## 2019-12-19 DIAGNOSIS — E1165 Type 2 diabetes mellitus with hyperglycemia: Secondary | ICD-10-CM | POA: Diagnosis present

## 2019-12-19 DIAGNOSIS — L97519 Non-pressure chronic ulcer of other part of right foot with unspecified severity: Secondary | ICD-10-CM | POA: Diagnosis present

## 2019-12-19 DIAGNOSIS — Z79899 Other long term (current) drug therapy: Secondary | ICD-10-CM

## 2019-12-19 DIAGNOSIS — A419 Sepsis, unspecified organism: Secondary | ICD-10-CM | POA: Diagnosis present

## 2019-12-19 DIAGNOSIS — Z794 Long term (current) use of insulin: Secondary | ICD-10-CM

## 2019-12-19 DIAGNOSIS — Z452 Encounter for adjustment and management of vascular access device: Secondary | ICD-10-CM

## 2019-12-19 DIAGNOSIS — N184 Chronic kidney disease, stage 4 (severe): Secondary | ICD-10-CM | POA: Diagnosis present

## 2019-12-19 DIAGNOSIS — E1140 Type 2 diabetes mellitus with diabetic neuropathy, unspecified: Secondary | ICD-10-CM | POA: Diagnosis present

## 2019-12-19 DIAGNOSIS — Z9289 Personal history of other medical treatment: Secondary | ICD-10-CM

## 2019-12-19 DIAGNOSIS — J9602 Acute respiratory failure with hypercapnia: Secondary | ICD-10-CM | POA: Diagnosis present

## 2019-12-19 DIAGNOSIS — E11628 Type 2 diabetes mellitus with other skin complications: Secondary | ICD-10-CM

## 2019-12-19 DIAGNOSIS — T402X1A Poisoning by other opioids, accidental (unintentional), initial encounter: Secondary | ICD-10-CM | POA: Diagnosis not present

## 2019-12-19 DIAGNOSIS — E1169 Type 2 diabetes mellitus with other specified complication: Secondary | ICD-10-CM | POA: Diagnosis present

## 2019-12-19 DIAGNOSIS — G8929 Other chronic pain: Secondary | ICD-10-CM | POA: Diagnosis present

## 2019-12-19 DIAGNOSIS — N179 Acute kidney failure, unspecified: Secondary | ICD-10-CM | POA: Diagnosis present

## 2019-12-19 DIAGNOSIS — E11621 Type 2 diabetes mellitus with foot ulcer: Secondary | ICD-10-CM | POA: Diagnosis present

## 2019-12-19 DIAGNOSIS — R778 Other specified abnormalities of plasma proteins: Secondary | ICD-10-CM

## 2019-12-19 DIAGNOSIS — Z20822 Contact with and (suspected) exposure to covid-19: Secondary | ICD-10-CM | POA: Diagnosis present

## 2019-12-19 DIAGNOSIS — R7989 Other specified abnormal findings of blood chemistry: Secondary | ICD-10-CM

## 2019-12-19 DIAGNOSIS — K5900 Constipation, unspecified: Secondary | ICD-10-CM | POA: Diagnosis not present

## 2019-12-19 DIAGNOSIS — G92 Toxic encephalopathy: Secondary | ICD-10-CM | POA: Diagnosis present

## 2019-12-19 DIAGNOSIS — K219 Gastro-esophageal reflux disease without esophagitis: Secondary | ICD-10-CM | POA: Diagnosis present

## 2019-12-19 DIAGNOSIS — IMO0002 Reserved for concepts with insufficient information to code with codable children: Secondary | ICD-10-CM | POA: Diagnosis present

## 2019-12-19 DIAGNOSIS — I509 Heart failure, unspecified: Secondary | ICD-10-CM | POA: Diagnosis present

## 2019-12-19 DIAGNOSIS — M869 Osteomyelitis, unspecified: Secondary | ICD-10-CM | POA: Diagnosis present

## 2019-12-19 DIAGNOSIS — Z9641 Presence of insulin pump (external) (internal): Secondary | ICD-10-CM | POA: Diagnosis present

## 2019-12-19 DIAGNOSIS — G929 Unspecified toxic encephalopathy: Secondary | ICD-10-CM

## 2019-12-19 DIAGNOSIS — G9340 Encephalopathy, unspecified: Secondary | ICD-10-CM

## 2019-12-19 DIAGNOSIS — E785 Hyperlipidemia, unspecified: Secondary | ICD-10-CM | POA: Diagnosis present

## 2019-12-19 DIAGNOSIS — E1122 Type 2 diabetes mellitus with diabetic chronic kidney disease: Secondary | ICD-10-CM | POA: Diagnosis present

## 2019-12-19 LAB — URINALYSIS, ROUTINE W REFLEX MICROSCOPIC
Bilirubin Urine: NEGATIVE
Glucose, UA: 50 mg/dL — AB
Hgb urine dipstick: NEGATIVE
Ketones, ur: NEGATIVE mg/dL
Leukocytes,Ua: NEGATIVE
Nitrite: NEGATIVE
Protein, ur: 30 mg/dL — AB
Specific Gravity, Urine: 1.016 (ref 1.005–1.030)
pH: 5 (ref 5.0–8.0)

## 2019-12-19 LAB — CBC WITH DIFFERENTIAL/PLATELET
Abs Immature Granulocytes: 0.37 10*3/uL — ABNORMAL HIGH (ref 0.00–0.07)
Basophils Absolute: 0 10*3/uL (ref 0.0–0.1)
Basophils Relative: 0 %
Eosinophils Absolute: 0 10*3/uL (ref 0.0–0.5)
Eosinophils Relative: 0 %
HCT: 35.7 % — ABNORMAL LOW (ref 36.0–46.0)
Hemoglobin: 10.7 g/dL — ABNORMAL LOW (ref 12.0–15.0)
Immature Granulocytes: 2 %
Lymphocytes Relative: 4 %
Lymphs Abs: 0.7 10*3/uL (ref 0.7–4.0)
MCH: 29.6 pg (ref 26.0–34.0)
MCHC: 30 g/dL (ref 30.0–36.0)
MCV: 98.9 fL (ref 80.0–100.0)
Monocytes Absolute: 0.8 10*3/uL (ref 0.1–1.0)
Monocytes Relative: 4 %
Neutro Abs: 15.7 10*3/uL — ABNORMAL HIGH (ref 1.7–7.7)
Neutrophils Relative %: 90 %
Platelets: 292 10*3/uL (ref 150–400)
RBC: 3.61 MIL/uL — ABNORMAL LOW (ref 3.87–5.11)
RDW: 14.8 % (ref 11.5–15.5)
WBC: 17.6 10*3/uL — ABNORMAL HIGH (ref 4.0–10.5)
nRBC: 1.1 % — ABNORMAL HIGH (ref 0.0–0.2)

## 2019-12-19 LAB — COMPREHENSIVE METABOLIC PANEL
ALT: 211 U/L — ABNORMAL HIGH (ref 0–44)
AST: 179 U/L — ABNORMAL HIGH (ref 15–41)
Albumin: 3.2 g/dL — ABNORMAL LOW (ref 3.5–5.0)
Alkaline Phosphatase: 171 U/L — ABNORMAL HIGH (ref 38–126)
Anion gap: 9 (ref 5–15)
BUN: 56 mg/dL — ABNORMAL HIGH (ref 6–20)
CO2: 25 mmol/L (ref 22–32)
Calcium: 7.9 mg/dL — ABNORMAL LOW (ref 8.9–10.3)
Chloride: 101 mmol/L (ref 98–111)
Creatinine, Ser: 3.52 mg/dL — ABNORMAL HIGH (ref 0.44–1.00)
GFR calc Af Amer: 17 mL/min — ABNORMAL LOW (ref >60)
GFR calc non Af Amer: 14 mL/min — ABNORMAL LOW (ref >60)
Glucose, Bld: 305 mg/dL — ABNORMAL HIGH (ref 70–99)
Potassium: >7.5 mmol/L (ref 3.5–5.1)
Sodium: 135 mmol/L (ref 135–145)
Total Bilirubin: 1 mg/dL (ref 0.3–1.2)
Total Protein: 6.9 g/dL (ref 6.5–8.1)

## 2019-12-19 LAB — ETHANOL: Alcohol, Ethyl (B): <10 mg/dL (ref <10)

## 2019-12-19 LAB — TROPONIN I (HIGH SENSITIVITY): Troponin I (High Sensitivity): 68 ng/L — ABNORMAL HIGH (ref <18)

## 2019-12-19 LAB — POC SARS CORONAVIRUS 2 AG -  ED: SARS Coronavirus 2 Ag: NEGATIVE

## 2019-12-19 NOTE — ED Notes (Signed)
Mother  [place]   [phone]

## 2019-12-19 NOTE — ED Provider Notes (Signed)
[facility] EMERGENCY DEPARTMENT Provider Note   CSN: [id number] Arrival date & time: 12/19/19  2123     History Chief Complaint  Patient presents with  . Unresponsive    Emma Patton is a 49 y.o. female.  HPI Level 5 caveat due to altered mental status.  Brought in by [facility].  Reportedly called after being found unresponsive.  Reportedly was diaphoretic with constricted pupils.  EMS gave 2.5 mg of Narcan reportedly woke up some.  Patient really cannot provide much history.  EMS stated that she told them she took 5 of her Percocets for her chest pain.  Patient told me initially she took 1 Percocet and then later told me she took "Covid".  Had been hypoxic for EMS with sats in the 60s.  Required nonrebreather here because would desat if nonrebreather been taken off.  Patient is on multiple medications.    No past medical history on file.  Patient Active Problem List   Diagnosis Date Noted  . Claustrophobia 09/04/2016  . Esophageal reflux 09/04/2016  . Leukocytosis 09/04/2016  . Osteomyelitis of toe of left foot ([place]) 09/04/2016  . AKI (acute kidney injury) ([place]) 03/21/2016  . Anemia 03/21/2016  . Gastroesophageal reflux disease 03/21/2016  . HTN (hypertension), benign 03/21/2016  . Chronic bilateral low back pain with right-sided sciatica 02/18/2016  . Discitis of lumbar region 02/18/2016  . 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 Osteomyelitis of spine ([place]) 02/18/2016  . Spinal stenosis 02/18/2016  . Lyme disease 12/20/2015  . On amiodarone therapy 06/24/2015  . PAF (paroxysmal atrial fibrillation) ([place]) 06/24/2015  . Baker's cyst of knee 06/12/2015  . Bilateral leg edema 06/12/2015  . Edema of lower extremity 06/12/2015  . Hypoalbuminemia 06/12/2015  . Diabetes mellitus ([place]) 06/04/2015  . DM type 2, uncontrolled, with neuropathy ([place]) 06/04/2015  . Elevated alkaline phosphatase level 06/04/2015  . Lumbar disc  herniation 07/13/2014       OB History   No obstetric history on file.     No family history on file.  Social History   Tobacco Use  . Smoking status: Unknown If Ever Smoked  . Smokeless tobacco: Never Used  Substance Use Topics  . Alcohol use: Not on file  . Drug use: Not on file    Home Medications Prior to Admission medications   Medication Sig Start Date End Date Taking? Authorizing Provider  amLODipine (NORVASC) 5 MG tablet Take 5 mg by mouth daily.    [provider]  furosemide (LASIX) 40 MG tablet Take 40 mg by mouth. 06/12/15   [provider]  gabapentin (NEURONTIN) 600 MG tablet Take 600 mg by mouth. 03/15/16   [provider]  linaclotide Rolan Lipa) 145 MCG CAPS capsule Take by mouth. 07/15/16   [provider]  metoCLOPramide (REGLAN) 10 MG tablet Take 10 mg by mouth.    [provider]  omeprazole (PRILOSEC) 20 MG capsule Take 20 mg by mouth. 12/05/15   [provider]  oxyCODONE-acetaminophen (PERCOCET) 10-325 MG tablet  04/02/16   [provider]  pregabalin (LYRICA) 75 MG capsule Take 75 mg by mouth. 07/22/16   [provider]  sitaGLIPtin (JANUVIA) 100 MG tablet Take 100 mg by mouth. 07/15/16   [provider]  traZODone (DESYREL) 100 MG tablet Take 100 mg by mouth. 03/07/16   [provider]  zolpidem (AMBIEN) 10 MG tablet Take 10 mg by mouth. 02/22/16   [provider]  Allergies    Penicillins  Review of Systems   Review of Systems  Unable to perform ROS: Mental status change    Physical Exam Updated Vital Signs BP (!) 116/57   Pulse 73   Temp 98.3 F (36.8 C) (Oral)   Resp 15   Ht 6\' 2"  (1.88 m)   Wt 79.4 kg   SpO2 98%   BMI 22.47 kg/m   Physical Exam Vitals reviewed.  HENT:     Head: Atraumatic.  Eyes:     Extraocular Movements: Extraocular movements intact.     Pupils: Pupils are equal, round, and reactive to light.  Cardiovascular:      Rate and Rhythm: Regular rhythm.  Pulmonary:     Comments: Mildly harsh breath sounds. Abdominal:     Tenderness: There is no abdominal tenderness.  Musculoskeletal:     Right lower leg: Edema present.     Left lower leg: Edema present.     Comments: Some edema bilateral lower extremities.  Neurological:     Mental Status: She is alert.     ED Results / Procedures / Treatments   Labs (all labs ordered are listed, but only abnormal results are displayed) Labs Reviewed  CBC WITH DIFFERENTIAL/PLATELET - Abnormal; Notable for the following components:      Result Value   WBC 17.6 (*)    RBC 3.61 (*)    Hemoglobin 10.7 (*)    HCT 35.7 (*)    nRBC 1.1 (*)    Neutro Abs 15.7 (*)    Abs Immature Granulocytes 0.37 (*)    All other components within normal limits  RAPID URINE DRUG SCREEN, HOSP PERFORMED  URINALYSIS, ROUTINE W REFLEX MICROSCOPIC  COMPREHENSIVE METABOLIC PANEL  BRAIN NATRIURETIC PEPTIDE  ETHANOL  POC SARS CORONAVIRUS 2 AG -  ED  TROPONIN I (HIGH SENSITIVITY)  TROPONIN I (HIGH SENSITIVITY)    EKG EKG Interpretation  Date/Time:  Monday December 19 2019 21:29:11 EDT Ventricular Rate:  76 PR Interval:    QRS Duration: 109 QT Interval:  409 QTC Calculation: 460 R Axis:   75 Text Interpretation: Sinus rhythm Low voltage, extremity and precordial leads Confirmed by Davonna Belling [phone]) on 12/19/2019 11:07:55 PM   Radiology DG Chest Portable 1 View  Result Date: 12/19/2019 CLINICAL DATA:  Altered mental status EXAM: PORTABLE CHEST 1 VIEW COMPARISON:  None. FINDINGS: Cardiomegaly, vascular congestion. Left perihilar airspace opacity. No visible effusions. No acute bony abnormality. IMPRESSION: Cardiomegaly with vascular congestion. Left perihilar airspace disease could reflect asymmetric edema or pneumonia. Electronically Signed   By: Rolm Baptise M.D.   On: 12/19/2019 21:57    Procedures Procedures (including critical care time)  Medications Ordered in ED  Medications - No data to display  ED Course  I have reviewed the triage vital signs and the nursing notes.  Pertinent labs & imaging results that were available during my care of the patient were reviewed by me and considered in my medical decision making (see chart for details).    MDM Rules/Calculators/A&P                      Patient with mental status change.  Improved some with Narcan and is on narcotics and is also on several medications that could affect her mental status.  Labs pending.  Patient is requiring a nonrebreather.  Initial Covid negative.  Care will be turned over to Dr. Roxanne Mins.  CRITICAL CARE Performed by: Davonna Belling Total critical care  time: 30 minutes Critical care time was exclusive of separately billable procedures and treating other patients. Critical care was necessary to treat or prevent imminent or life-threatening deterioration. Critical care was time spent personally by me on the following activities: development of treatment plan with patient and/or surrogate as well as nursing, discussions with consultants, evaluation of patient's response to treatment, examination of patient, obtaining history from patient or surrogate, ordering and performing treatments and interventions, ordering and review of laboratory studies, ordering and review of radiographic studies, pulse oximetry and re-evaluation of patient's condition.  Final Clinical Impression(s) / ED Diagnoses Final diagnoses:  Encephalopathy    Rx / DC Orders ED Discharge Orders    None       Davonna Belling, MD 12/19/19 2316

## 2019-12-19 NOTE — ED Triage Notes (Signed)
Pt to ED via [facility].  EMS reports they were  Called by pt's husband who stated he found pt unresponsive in the bed.  On EMS arrival pt remained unresponsive with pin-point pupils.  EMS gave pt Narcan 0.5mg  and pt became more awake.

## 2019-12-20 ENCOUNTER — Inpatient Hospital Stay (HOSPITAL_COMMUNITY): Payer: Medicare Other

## 2019-12-20 ENCOUNTER — Emergency Department (HOSPITAL_COMMUNITY): Payer: Medicare Other

## 2019-12-20 DIAGNOSIS — K219 Gastro-esophageal reflux disease without esophagitis: Secondary | ICD-10-CM | POA: Diagnosis present

## 2019-12-20 DIAGNOSIS — R609 Edema, unspecified: Secondary | ICD-10-CM

## 2019-12-20 DIAGNOSIS — N170 Acute kidney failure with tubular necrosis: Secondary | ICD-10-CM | POA: Diagnosis not present

## 2019-12-20 DIAGNOSIS — E1122 Type 2 diabetes mellitus with diabetic chronic kidney disease: Secondary | ICD-10-CM | POA: Diagnosis present

## 2019-12-20 DIAGNOSIS — G8929 Other chronic pain: Secondary | ICD-10-CM | POA: Diagnosis present

## 2019-12-20 DIAGNOSIS — T402X1A Poisoning by other opioids, accidental (unintentional), initial encounter: Secondary | ICD-10-CM | POA: Diagnosis present

## 2019-12-20 DIAGNOSIS — Z79891 Long term (current) use of opiate analgesic: Secondary | ICD-10-CM | POA: Diagnosis not present

## 2019-12-20 DIAGNOSIS — Z9641 Presence of insulin pump (external) (internal): Secondary | ICD-10-CM | POA: Diagnosis present

## 2019-12-20 DIAGNOSIS — A419 Sepsis, unspecified organism: Secondary | ICD-10-CM | POA: Diagnosis present

## 2019-12-20 DIAGNOSIS — E875 Hyperkalemia: Secondary | ICD-10-CM | POA: Diagnosis present

## 2019-12-20 DIAGNOSIS — N189 Chronic kidney disease, unspecified: Secondary | ICD-10-CM

## 2019-12-20 DIAGNOSIS — G92 Toxic encephalopathy: Secondary | ICD-10-CM | POA: Diagnosis present

## 2019-12-20 DIAGNOSIS — Z20822 Contact with and (suspected) exposure to covid-19: Secondary | ICD-10-CM | POA: Diagnosis present

## 2019-12-20 DIAGNOSIS — E785 Hyperlipidemia, unspecified: Secondary | ICD-10-CM | POA: Diagnosis present

## 2019-12-20 DIAGNOSIS — E1169 Type 2 diabetes mellitus with other specified complication: Secondary | ICD-10-CM | POA: Diagnosis present

## 2019-12-20 DIAGNOSIS — M869 Osteomyelitis, unspecified: Secondary | ICD-10-CM | POA: Diagnosis present

## 2019-12-20 DIAGNOSIS — Z6841 Body Mass Index (BMI) 40.0 and over, adult: Secondary | ICD-10-CM | POA: Diagnosis not present

## 2019-12-20 DIAGNOSIS — G9340 Encephalopathy, unspecified: Secondary | ICD-10-CM | POA: Diagnosis not present

## 2019-12-20 DIAGNOSIS — E11621 Type 2 diabetes mellitus with foot ulcer: Secondary | ICD-10-CM | POA: Diagnosis present

## 2019-12-20 DIAGNOSIS — L97519 Non-pressure chronic ulcer of other part of right foot with unspecified severity: Secondary | ICD-10-CM | POA: Diagnosis present

## 2019-12-20 DIAGNOSIS — J9601 Acute respiratory failure with hypoxia: Secondary | ICD-10-CM

## 2019-12-20 DIAGNOSIS — N179 Acute kidney failure, unspecified: Secondary | ICD-10-CM

## 2019-12-20 DIAGNOSIS — Z794 Long term (current) use of insulin: Secondary | ICD-10-CM | POA: Diagnosis not present

## 2019-12-20 DIAGNOSIS — I130 Hypertensive heart and chronic kidney disease with heart failure and stage 1 through stage 4 chronic kidney disease, or unspecified chronic kidney disease: Secondary | ICD-10-CM | POA: Diagnosis present

## 2019-12-20 DIAGNOSIS — Z79899 Other long term (current) drug therapy: Secondary | ICD-10-CM | POA: Diagnosis not present

## 2019-12-20 DIAGNOSIS — J9602 Acute respiratory failure with hypercapnia: Secondary | ICD-10-CM | POA: Diagnosis present

## 2019-12-20 DIAGNOSIS — N184 Chronic kidney disease, stage 4 (severe): Secondary | ICD-10-CM | POA: Diagnosis present

## 2019-12-20 HISTORY — DX: Acute respiratory failure with hypoxia: J96.01

## 2019-12-20 LAB — POCT I-STAT 7, (LYTES, BLD GAS, ICA,H+H)
Acid-base deficit: 3 mmol/L — ABNORMAL HIGH (ref 0.0–2.0)
Bicarbonate: 26.3 mmol/L (ref 20.0–28.0)
Calcium, Ion: 1.14 mmol/L — ABNORMAL LOW (ref 1.15–1.40)
HCT: 34 % — ABNORMAL LOW (ref 36.0–46.0)
Hemoglobin: 11.6 g/dL — ABNORMAL LOW (ref 12.0–15.0)
O2 Saturation: 92 %
Patient temperature: 97.8
Potassium: 6.8 mmol/L (ref 3.5–5.1)
Sodium: 134 mmol/L — ABNORMAL LOW (ref 135–145)
TCO2: 28 mmol/L (ref 22–32)
pCO2 arterial: 64.4 mmHg — ABNORMAL HIGH (ref 32.0–48.0)
pH, Arterial: 7.216 — ABNORMAL LOW (ref 7.350–7.450)
pO2, Arterial: 77 mmHg — ABNORMAL LOW (ref 83.0–108.0)

## 2019-12-20 LAB — TROPONIN I (HIGH SENSITIVITY): Troponin I (High Sensitivity): 58 ng/L — ABNORMAL HIGH (ref <18)

## 2019-12-20 LAB — BASIC METABOLIC PANEL
Anion gap: 12 (ref 5–15)
Anion gap: 12 (ref 5–15)
Anion gap: 9 (ref 5–15)
BUN: 59 mg/dL — ABNORMAL HIGH (ref 6–20)
BUN: 61 mg/dL — ABNORMAL HIGH (ref 6–20)
BUN: 60 mg/dL — ABNORMAL HIGH (ref 6–20)
CO2: 23 mmol/L (ref 22–32)
CO2: 22 mmol/L (ref 22–32)
CO2: 25 mmol/L (ref 22–32)
Calcium: 8.2 mg/dL — ABNORMAL LOW (ref 8.9–10.3)
Calcium: 7.9 mg/dL — ABNORMAL LOW (ref 8.9–10.3)
Calcium: 7.4 mg/dL — ABNORMAL LOW (ref 8.9–10.3)
Chloride: 100 mmol/L (ref 98–111)
Chloride: 102 mmol/L (ref 98–111)
Chloride: 103 mmol/L (ref 98–111)
Creatinine, Ser: 3.7 mg/dL — ABNORMAL HIGH (ref 0.44–1.00)
Creatinine, Ser: 3.76 mg/dL — ABNORMAL HIGH (ref 0.44–1.00)
Creatinine, Ser: 3.83 mg/dL — ABNORMAL HIGH (ref 0.44–1.00)
GFR calc Af Amer: 16 mL/min — ABNORMAL LOW (ref >60)
GFR calc Af Amer: 15 mL/min — ABNORMAL LOW (ref >60)
GFR calc Af Amer: 15 mL/min — ABNORMAL LOW (ref >60)
GFR calc non Af Amer: 13 mL/min — ABNORMAL LOW (ref >60)
GFR calc non Af Amer: 13 mL/min — ABNORMAL LOW (ref >60)
GFR calc non Af Amer: 13 mL/min — ABNORMAL LOW (ref >60)
Glucose, Bld: 261 mg/dL — ABNORMAL HIGH (ref 70–99)
Glucose, Bld: 259 mg/dL — ABNORMAL HIGH (ref 70–99)
Glucose, Bld: 204 mg/dL — ABNORMAL HIGH (ref 70–99)
Potassium: 6.6 mmol/L (ref 3.5–5.1)
Potassium: 7.3 mmol/L (ref 3.5–5.1)
Potassium: 6.2 mmol/L — ABNORMAL HIGH (ref 3.5–5.1)
Sodium: 135 mmol/L (ref 135–145)
Sodium: 136 mmol/L (ref 135–145)
Sodium: 137 mmol/L (ref 135–145)

## 2019-12-20 LAB — CBG MONITORING, ED
Glucose-Capillary: 254 mg/dL — ABNORMAL HIGH (ref 70–99)
Glucose-Capillary: 252 mg/dL — ABNORMAL HIGH (ref 70–99)

## 2019-12-20 LAB — ECHOCARDIOGRAM COMPLETE
Height: 74 in
Weight: 5128.78 oz

## 2019-12-20 LAB — PROTIME-INR
INR: 1.2 (ref 0.8–1.2)
Prothrombin Time: 15 seconds (ref 11.4–15.2)

## 2019-12-20 LAB — HEMOGLOBIN A1C
Hgb A1c MFr Bld: 10.9 % — ABNORMAL HIGH (ref 4.8–5.6)
Mean Plasma Glucose: 266.13 mg/dL

## 2019-12-20 LAB — COMPREHENSIVE METABOLIC PANEL
ALT: 211 U/L — ABNORMAL HIGH (ref 0–44)
AST: 122 U/L — ABNORMAL HIGH (ref 15–41)
Albumin: 2.9 g/dL — ABNORMAL LOW (ref 3.5–5.0)
Alkaline Phosphatase: 133 U/L — ABNORMAL HIGH (ref 38–126)
Anion gap: 10 (ref 5–15)
BUN: 62 mg/dL — ABNORMAL HIGH (ref 6–20)
CO2: 22 mmol/L (ref 22–32)
Calcium: 7.4 mg/dL — ABNORMAL LOW (ref 8.9–10.3)
Chloride: 104 mmol/L (ref 98–111)
Creatinine, Ser: 3.77 mg/dL — ABNORMAL HIGH (ref 0.44–1.00)
GFR calc Af Amer: 15 mL/min — ABNORMAL LOW (ref >60)
GFR calc non Af Amer: 13 mL/min — ABNORMAL LOW (ref >60)
Glucose, Bld: 179 mg/dL — ABNORMAL HIGH (ref 70–99)
Potassium: 6.5 mmol/L (ref 3.5–5.1)
Sodium: 136 mmol/L (ref 135–145)
Total Bilirubin: 0.6 mg/dL (ref 0.3–1.2)
Total Protein: 6.3 g/dL — ABNORMAL LOW (ref 6.5–8.1)

## 2019-12-20 LAB — HEPATIC FUNCTION PANEL
ALT: 284 U/L — ABNORMAL HIGH (ref 0–44)
AST: 227 U/L — ABNORMAL HIGH (ref 15–41)
Albumin: 3.5 g/dL (ref 3.5–5.0)
Alkaline Phosphatase: 173 U/L — ABNORMAL HIGH (ref 38–126)
Bilirubin, Direct: 0.2 mg/dL (ref 0.0–0.2)
Indirect Bilirubin: 0.6 mg/dL (ref 0.3–0.9)
Total Bilirubin: 0.8 mg/dL (ref 0.3–1.2)
Total Protein: 7.4 g/dL (ref 6.5–8.1)

## 2019-12-20 LAB — RESPIRATORY PANEL BY RT PCR (FLU A&B, COVID)
Influenza A by PCR: NEGATIVE
Influenza B by PCR: NEGATIVE
SARS Coronavirus 2 by RT PCR: NEGATIVE

## 2019-12-20 LAB — C-REACTIVE PROTEIN: CRP: 7.1 mg/dL — ABNORMAL HIGH (ref <1.0)

## 2019-12-20 LAB — MRSA PCR SCREENING: MRSA by PCR: POSITIVE — AB

## 2019-12-20 LAB — RAPID URINE DRUG SCREEN, HOSP PERFORMED
Amphetamines: NOT DETECTED
Barbiturates: NOT DETECTED
Benzodiazepines: NOT DETECTED
Cocaine: NOT DETECTED
Opiates: POSITIVE — AB
Tetrahydrocannabinol: NOT DETECTED

## 2019-12-20 LAB — POTASSIUM
Potassium: >7.5 mmol/L (ref 3.5–5.1)
Potassium: 5.4 mmol/L — ABNORMAL HIGH (ref 3.5–5.1)

## 2019-12-20 LAB — HIV ANTIBODY (ROUTINE TESTING W REFLEX): HIV Screen 4th Generation wRfx: NONREACTIVE

## 2019-12-20 LAB — GLUCOSE, CAPILLARY
Glucose-Capillary: 166 mg/dL — ABNORMAL HIGH (ref 70–99)
Glucose-Capillary: 169 mg/dL — ABNORMAL HIGH (ref 70–99)
Glucose-Capillary: 217 mg/dL — ABNORMAL HIGH (ref 70–99)
Glucose-Capillary: 250 mg/dL — ABNORMAL HIGH (ref 70–99)

## 2019-12-20 LAB — NA AND K (SODIUM & POTASSIUM), RAND UR
Potassium Urine: 53 mmol/L
Sodium, Ur: 27 mmol/L

## 2019-12-20 LAB — SARS CORONAVIRUS 2 (TAT 6-24 HRS): SARS Coronavirus 2: NEGATIVE

## 2019-12-20 LAB — BRAIN NATRIURETIC PEPTIDE: B Natriuretic Peptide: 296.5 pg/mL — ABNORMAL HIGH (ref 0.0–100.0)

## 2019-12-20 LAB — CREATININE, URINE, RANDOM: Creatinine, Urine: 205.95 mg/dL

## 2019-12-20 LAB — CK: Total CK: 2234 U/L — ABNORMAL HIGH (ref 38–234)

## 2019-12-20 LAB — D-DIMER, QUANTITATIVE: D-Dimer, Quant: 8.51 ug/mL-FEU — ABNORMAL HIGH (ref 0.00–0.50)

## 2019-12-20 LAB — MAGNESIUM: Magnesium: 2.2 mg/dL (ref 1.7–2.4)

## 2019-12-20 LAB — PROCALCITONIN: Procalcitonin: 1.32 ng/mL

## 2019-12-20 MED ORDER — SODIUM BICARBONATE 8.4 % IV SOLN
50.0000 meq | Freq: Once | INTRAVENOUS | Status: AC
  Administered 2019-12-20: 50 meq via INTRAVENOUS
  Filled 2019-12-20: qty 50

## 2019-12-20 MED ORDER — FENTANYL CITRATE (PF) 100 MCG/2ML IJ SOLN
INTRAMUSCULAR | Status: AC
  Filled 2019-12-20: qty 2

## 2019-12-20 MED ORDER — INSULIN ASPART 100 UNIT/ML ~~LOC~~ SOLN
5.0000 [IU] | Freq: Once | SUBCUTANEOUS | Status: AC
  Administered 2019-12-20: 5 [IU] via SUBCUTANEOUS

## 2019-12-20 MED ORDER — VANCOMYCIN HCL 10 G IV SOLR
2500.0000 mg | INTRAVENOUS | Status: AC
  Administered 2019-12-20: 2500 mg via INTRAVENOUS
  Filled 2019-12-20: qty 2500

## 2019-12-20 MED ORDER — FUROSEMIDE 10 MG/ML IJ SOLN
20.0000 mg | Freq: Once | INTRAMUSCULAR | Status: AC
  Administered 2019-12-20: 20 mg via INTRAVENOUS
  Filled 2019-12-20: qty 2

## 2019-12-20 MED ORDER — INSULIN ASPART 100 UNIT/ML ~~LOC~~ SOLN
0.0000 [IU] | SUBCUTANEOUS | Status: DC
  Administered 2019-12-20: 11 [IU] via SUBCUTANEOUS
  Administered 2019-12-20: 7 [IU] via SUBCUTANEOUS
  Administered 2019-12-20: 4 [IU] via SUBCUTANEOUS
  Administered 2019-12-20: 7 [IU] via SUBCUTANEOUS
  Administered 2019-12-21: 4 [IU] via SUBCUTANEOUS
  Administered 2019-12-21: 3 [IU] via SUBCUTANEOUS
  Administered 2019-12-21 (×3): 4 [IU] via SUBCUTANEOUS
  Administered 2019-12-22 (×2): 7 [IU] via SUBCUTANEOUS
  Administered 2019-12-22: 3 [IU] via SUBCUTANEOUS
  Administered 2019-12-22: 4 [IU] via SUBCUTANEOUS
  Administered 2019-12-22: 11 [IU] via SUBCUTANEOUS
  Administered 2019-12-23: 20 [IU] via SUBCUTANEOUS
  Administered 2019-12-23: 7 [IU] via SUBCUTANEOUS
  Administered 2019-12-23: 3 [IU] via SUBCUTANEOUS
  Administered 2019-12-23: 7 [IU] via SUBCUTANEOUS
  Administered 2019-12-23: 4 [IU] via SUBCUTANEOUS
  Administered 2019-12-23: 7 [IU] via SUBCUTANEOUS
  Administered 2019-12-24: 3 [IU] via SUBCUTANEOUS
  Administered 2019-12-24: 4 [IU] via SUBCUTANEOUS
  Administered 2019-12-24: 15 [IU] via SUBCUTANEOUS
  Administered 2019-12-24: 4 [IU] via SUBCUTANEOUS
  Administered 2019-12-24: 7 [IU] via SUBCUTANEOUS
  Administered 2019-12-24: 11 [IU] via SUBCUTANEOUS
  Administered 2019-12-24 – 2019-12-25 (×2): 4 [IU] via SUBCUTANEOUS
  Administered 2019-12-25: 15 [IU] via SUBCUTANEOUS
  Administered 2019-12-25: 4 [IU] via SUBCUTANEOUS
  Administered 2019-12-25: 11 [IU] via SUBCUTANEOUS
  Administered 2019-12-25: 4 [IU] via SUBCUTANEOUS
  Administered 2019-12-26 (×2): 7 [IU] via SUBCUTANEOUS
  Administered 2019-12-26: 3 [IU] via SUBCUTANEOUS

## 2019-12-20 MED ORDER — PRISMASOL BGK 4/2.5 32-4-2.5 MEQ/L REPLACEMENT SOLN
Status: DC
  Filled 2019-12-20 (×6): qty 5000

## 2019-12-20 MED ORDER — SODIUM POLYSTYRENE SULFONATE 15 GM/60ML PO SUSP
30.0000 g | Freq: Once | ORAL | Status: AC
  Administered 2019-12-20: 30 g via ORAL
  Filled 2019-12-20: qty 120

## 2019-12-20 MED ORDER — CALCIUM GLUCONATE 10 % IV SOLN
1.0000 g | Freq: Once | INTRAVENOUS | Status: AC
  Administered 2019-12-20: 1 g via INTRAVENOUS
  Filled 2019-12-20: qty 10

## 2019-12-20 MED ORDER — MIDAZOLAM HCL 2 MG/2ML IJ SOLN: 0.5000 mg | INTRAMUSCULAR | Status: DC | PRN

## 2019-12-20 MED ORDER — SODIUM ZIRCONIUM CYCLOSILICATE 5 G PO PACK
10.0000 g | PACK | Freq: Three times a day (TID) | ORAL | Status: DC
  Administered 2019-12-20 – 2019-12-21 (×3): 10 g via ORAL
  Filled 2019-12-20 (×3): qty 2

## 2019-12-20 MED ORDER — DEXTROSE 50 % IV SOLN
1.0000 | Freq: Once | INTRAVENOUS | Status: AC
  Administered 2019-12-20: 50 mL via INTRAVENOUS
  Filled 2019-12-20: qty 50

## 2019-12-20 MED ORDER — PRISMASOL BGK 0/2.5 32-2.5 MEQ/L IV SOLN
INTRAVENOUS | Status: DC
  Filled 2019-12-20 (×19): qty 5000

## 2019-12-20 MED ORDER — PANTOPRAZOLE SODIUM 40 MG IV SOLR
40.0000 mg | Freq: Every day | INTRAVENOUS | Status: DC
  Administered 2019-12-20 – 2019-12-21 (×2): 40 mg via INTRAVENOUS
  Filled 2019-12-20 (×2): qty 40

## 2019-12-20 MED ORDER — INSULIN ASPART 100 UNIT/ML IV SOLN
10.0000 [IU] | Freq: Once | INTRAVENOUS | Status: AC
  Administered 2019-12-20: 10 [IU] via INTRAVENOUS

## 2019-12-20 MED ORDER — INSULIN ASPART 100 UNIT/ML IV SOLN
5.0000 [IU] | Freq: Once | INTRAVENOUS | Status: AC
  Administered 2019-12-20: 5 [IU] via INTRAVENOUS

## 2019-12-20 MED ORDER — HEPARIN SODIUM (PORCINE) 1000 UNIT/ML DIALYSIS
1000.0000 [IU] | INTRAMUSCULAR | Status: DC | PRN
  Administered 2019-12-21: 1000 [IU] via INTRAVENOUS_CENTRAL
  Filled 2019-12-20: qty 4
  Filled 2019-12-20 (×2): qty 6

## 2019-12-20 MED ORDER — FUROSEMIDE 10 MG/ML IJ SOLN
120.0000 mg | Freq: Once | INTRAVENOUS | Status: AC
  Administered 2019-12-20: 120 mg via INTRAVENOUS
  Filled 2019-12-20: qty 10

## 2019-12-20 MED ORDER — PRISMASOL BGK 0/2.5 32-2.5 MEQ/L REPLACEMENT SOLN
Status: DC
  Filled 2019-12-20 (×4): qty 5000

## 2019-12-20 MED ORDER — FENTANYL CITRATE (PF) 100 MCG/2ML IJ SOLN: 50.0000 ug | Freq: Once | INTRAMUSCULAR | Status: DC | PRN

## 2019-12-20 MED ORDER — SODIUM ZIRCONIUM CYCLOSILICATE 5 G PO PACK: 10.0000 g | PACK | Freq: Once | ORAL | Status: DC

## 2019-12-20 MED ORDER — HEPARIN SODIUM (PORCINE) 5000 UNIT/ML IJ SOLN
5000.0000 [IU] | Freq: Three times a day (TID) | INTRAMUSCULAR | Status: DC
  Administered 2019-12-20 – 2019-12-26 (×19): 5000 [IU] via SUBCUTANEOUS
  Filled 2019-12-20 (×19): qty 1

## 2019-12-20 MED ORDER — MUPIROCIN 2 % EX OINT
1.0000 "application " | TOPICAL_OINTMENT | Freq: Two times a day (BID) | CUTANEOUS | Status: AC
  Administered 2019-12-20 – 2019-12-24 (×9): 1 via NASAL
  Filled 2019-12-20 (×5): qty 22

## 2019-12-20 MED ORDER — MIDAZOLAM HCL 2 MG/2ML IJ SOLN
INTRAMUSCULAR | Status: AC
  Filled 2019-12-20: qty 2

## 2019-12-20 MED ORDER — CHLORHEXIDINE GLUCONATE CLOTH 2 % EX PADS
6.0000 | MEDICATED_PAD | Freq: Every day | CUTANEOUS | Status: AC
  Administered 2019-12-20 – 2019-12-22 (×2): 6 via TOPICAL

## 2019-12-20 MED ORDER — ONDANSETRON HCL 4 MG/2ML IJ SOLN
4.0000 mg | Freq: Four times a day (QID) | INTRAMUSCULAR | Status: DC | PRN
  Administered 2019-12-22: 4 mg via INTRAVENOUS
  Filled 2019-12-20: qty 2

## 2019-12-20 MED ORDER — ALBUTEROL SULFATE (2.5 MG/3ML) 0.083% IN NEBU
10.0000 mg | INHALATION_SOLUTION | Freq: Once | RESPIRATORY_TRACT | Status: AC
  Administered 2019-12-20: 10 mg via RESPIRATORY_TRACT
  Filled 2019-12-20: qty 12

## 2019-12-20 MED ORDER — PERFLUTREN LIPID MICROSPHERE
1.0000 mL | INTRAVENOUS | Status: AC | PRN
  Administered 2019-12-20: 2 mL via INTRAVENOUS
  Filled 2019-12-20: qty 10

## 2019-12-20 MED ORDER — SODIUM ZIRCONIUM CYCLOSILICATE 10 G PO PACK
10.0000 g | PACK | Freq: Once | ORAL | Status: AC
  Administered 2019-12-20: 10 g via ORAL
  Filled 2019-12-20: qty 1

## 2019-12-20 MED ORDER — HYDROCERIN EX CREA
TOPICAL_CREAM | Freq: Two times a day (BID) | CUTANEOUS | Status: DC
  Administered 2019-12-23: 1 via TOPICAL
  Filled 2019-12-20 (×3): qty 113

## 2019-12-20 MED ORDER — SODIUM CHLORIDE 0.9 % IV BOLUS
500.0000 mL | Freq: Once | INTRAVENOUS | Status: AC
  Administered 2019-12-20: 500 mL via INTRAVENOUS

## 2019-12-20 MED ORDER — VANCOMYCIN VARIABLE DOSE PER UNSTABLE RENAL FUNCTION (PHARMACIST DOSING): Status: DC

## 2019-12-20 NOTE — H&P (Signed)
NAME:  Emma Patton, MRN:  [id number], DOB:  [birth year], LOS: 0 ADMISSION DATE:  12/19/2019, CONSULTATION DATE:  3/23 REFERRING MD: Dr. Roxanne Mins EDP, CHIEF COMPLAINT: Altered mental status  Brief History   49 year old female with multiple medical problems found down by her husband at home after reportedly taking Percocet.  Did respond to Narcan in the field.  Was hypoxic in the ER requiring supplemental O2.  Hyperkalemic to 7.5.  History of present illness   49 year old female with past medical history as below which is significant for chronic kidney disease and hypertension.  She also takes chronic opioids for back pain with a history of osteomyelitis of the spine.  In the late evening hours of 3/22 she was unable to be aroused by her husband.  It is reported that she took opioid pain medications earlier in the night.  EMS was called and administered Narcan to which she had a positive response.    She was transported to the emergency department at [facility].  She was unable to give a good history at that time.  Upon arrival to the emergency department she was noted to be hypoxemic requiring supplemental oxygen.  Her oxygen saturation dropped as low as into the 60s at one point.  Laboratory evaluation was significant for creatinine of 3.5, potassium of greater than 7.5, high-sensitivity troponin of 68, AST 179, ALT 211, WBC 17.6, and hemoglobin 10.7.  CT of the head was unremarkable.  Initial COVID-19 antigen testing was negative.  Her mental status did improve after Narcan administration and with supplemental oxygen.  Her hyperkalemia was treated with temporizing measures as well as a one-time dose of Lokelma.  Given hyperkalemia and hypoxemia she was felt to be a candidate for admission to ICU and PCCM was consulted.  She is a bit more awake now, but still rather unclear of the events preceding her presentation tonight.  For example, she tells that she takes Percocet 3 times a day but  also tells that she has not taken it for several days.  History from her seems unreliable at this time.  Past Medical History  CKD, DM, GERD, Lyme disease, Bacteremia, Osteomyelitis of the spine, HTN.   Significant Hospital Events   3/22 admitted  Consults:    Procedures:    Significant Diagnostic Tests:  CT head 3/23> no acute intracranial abnormality.  Chronic small vessel ischemia  Micro Data:    Antimicrobials:     Interim history/subjective:    Objective   Blood pressure (!) 127/53, pulse 74, temperature 97.8 F (36.6 C), temperature source Rectal, resp. rate 18, height 6\' 2"  (1.88 m), weight 79.4 kg, SpO2 98 %.       No intake or output data in the 24 hours ending 12/20/19 0313 Filed Weights   12/19/19 2134  Weight: 79.4 kg    Examination: General: Morbidly obese middle-aged female in no acute distress HENT: Normocephalic, atraumatic, mucous membranes moist. Lungs: Coarse throughout Cardiovascular: Regular rate and rhythm, no murmurs rubs gallops.  Bilateral 2+ lower extremity pitting edema Abdomen: Soft, nontender, nondistended Extremities: No acute deformity.  She does have a bandaged wound on the sole of her right foot. Neuro: Alert, oriented, nonfocal.  She still does have some confusion and is not able to answer most history related questions with accuracy.  Resolved Hospital Problem list     Assessment & Plan:   Acute hypoxemic respiratory failure: Suspect this is related to pulmonary edema.  Cannot rule out infectious process, specifically  COVID-19 infection.  She has no sick contacts that she is aware of. - Supplemental oxygen titrated to oxygen saturation greater than 90% - Diuresis as tolerated - Awaiting COVID-19 PCR result - We will hold off on COVID-19 therapeutics -No role for steroids at this time - If COVID testing negative she may benefit from BiPAP.   Acute renal failure: Fortunately at this time she is still making urine Chronic  kidney disease - We will give her a trial of diuresis - Trend renal function panel -Renal ultrasound -Strict INO  Hyperkalemia:  -Bicarb, calcium, insulin, and Lokelma given in the ED around 2:30 AM -Give Lasix -Repeat renal function panel -Repeat EKG in the morning -If hyperkalemia not improved will need to consult nephrology for likely dialysis  Diabetes mellitus: poorly controlled in past.  - Hold home sitagliptin  -CBG monitoring and SSI - HgbA1c  Hypertension: Home medication list includes amlodipine and lasix - Will dose lasix IV - Hold amlodipine for borderline hypotension.   Chronic back pain:  History of bacteremia and osteomyelitis of spine - holding home gabapentin, pregabalin, and percocet  Leukocytosis: afebrile, no clear infectious source.  - Check PCT - Hold off on ABX for now.   Best practice:  Diet: NPO Pain/Anxiety/Delirium protocol (if indicated): NA VAP protocol (if indicated): NA DVT prophylaxis: SQH GI prophylaxis: NA Glucose control: SSI Mobility: BR Code Status: FULL Family Communication: No contact information available for husband Disposition: ICU  Labs   CBC: Recent Labs  Lab 12/19/19 2125  WBC 17.6*  NEUTROABS 15.7*  HGB 10.7*  HCT 35.7*  MCV 98.9  PLT 250    Basic Metabolic Panel: Recent Labs  Lab 12/19/19 2125  NA 135  K >7.5*  CL 101  CO2 25  GLUCOSE 305*  BUN 56*  CREATININE 3.52*  CALCIUM 7.9*   GFR: Estimated Creatinine Clearance: 23.5 mL/min (A) (by C-G formula based on SCr of 3.52 mg/dL (H)). Recent Labs  Lab 12/19/19 2125  WBC 17.6*    Liver Function Tests: Recent Labs  Lab 12/19/19 2125  AST 179*  ALT 211*  ALKPHOS 171*  BILITOT 1.0  PROT 6.9  ALBUMIN 3.2*   No results for input(s): LIPASE, AMYLASE in the last 168 hours. No results for input(s): AMMONIA in the last 168 hours.  ABG No results found for: PHART, PCO2ART, PO2ART, HCO3, TCO2, ACIDBASEDEF, O2SAT   Coagulation Profile: No  results for input(s): INR, PROTIME in the last 168 hours.  Cardiac Enzymes: No results for input(s): CKTOTAL, CKMB, CKMBINDEX, TROPONINI in the last 168 hours.  HbA1C: No results found for: HGBA1C  CBG: Recent Labs  Lab 12/20/19 0213  GLUCAP 254*    Review of Systems:   Bolds are positive  Constitutional: weight loss, gain, night sweats, Fevers, chills, fatigue .  HEENT: headaches, Sore throat, sneezing, nasal congestion, post nasal drip, Difficulty swallowing, Tooth/dental problems, visual complaints visual changes, ear ache CV:  chest pain, radiates:,Orthopnea, PND, swelling in lower extremities, dizziness, palpitations, syncope.  GI  heartburn, indigestion, abdominal pain, nausea, vomiting, diarrhea, change in bowel habits, loss of appetite, bloody stools.   Resp: cough, productive:, hemoptysis, dyspnea, chest pain, pleuritic.  Skin: rash or itching or icterus GU: dysuria, change in color of urine, urgency or frequency. flank pain, hematuria  MS: joint pain or swelling. decreased range of motion  Psych: change in mood or affect. depression or anxiety.  Neuro: difficulty with speech, weakness, numbness, ataxia    Past Medical History  She,  has no past medical history on file.   Surgical History     Social History   has an unknown smoking status. She has never used smokeless tobacco.   Family History   Her family history is not on file.   Allergies Allergies  Allergen Reactions  . Penicillins Rash and Hives     Home Medications  Prior to Admission medications   Medication Sig Start Date End Date Taking? Authorizing Provider  amLODipine (NORVASC) 5 MG tablet Take 5 mg by mouth daily.    [provider]  furosemide (LASIX) 40 MG tablet Take 40 mg by mouth. 06/12/15   [provider]  gabapentin (NEURONTIN) 600 MG tablet Take 600 mg by mouth. 03/15/16   [provider]  linaclotide Rolan Lipa) 145 MCG CAPS capsule Take by mouth. 07/15/16    [provider]  metoCLOPramide (REGLAN) 10 MG tablet Take 10 mg by mouth.    [provider]  omeprazole (PRILOSEC) 20 MG capsule Take 20 mg by mouth. 12/05/15   [provider]  oxyCODONE-acetaminophen (PERCOCET) 10-325 MG tablet  04/02/16   [provider]  pregabalin (LYRICA) 75 MG capsule Take 75 mg by mouth. 07/22/16   [provider]  sitaGLIPtin (JANUVIA) 100 MG tablet Take 100 mg by mouth. 07/15/16   [provider]  traZODone (DESYREL) 100 MG tablet Take 100 mg by mouth. 03/07/16   [provider]  zolpidem (AMBIEN) 10 MG tablet Take 10 mg by mouth. 02/22/16   [provider]     Critical care time: 40 minutes    Georgann Housekeeper, AGACNP-BC [place] for personal pager PCCM on call pager [phone]  12/20/2019 3:34 AM

## 2019-12-20 NOTE — ED Provider Notes (Signed)
Care assumed from Dr. Alvino Chapel, patient presented with possible overdose and altered mental status pending CT of head and labs.  CT of head did not show any acute changes.  Labs were significant for potassium greater than 7.5 but no evidence of hemolysis.  She also had renal insufficiency and elevated liver enzymes.  Troponin is mildly elevated and repeat troponin was falling.  Unfortunately, the elevated potassium level was not relayed to me in a timely fashion.  When I noted the result, treatment was initiated with intravenous insulin, calcium, serum bicarbonate has as well as oral sodium zirconium cyclosilicate and nebulized albuterol.  Case is discussed with Dr. Lucile Shutters of critical care service.  Because of hyperkalemia, she will need to go to ICU.  He requests potassium to be repeated to make sure it is a real value.  She has no prior labs in the [facility] health system, but on care everywhere, I did note creatinine of 1.52 in February 2018.  Patient does see a nephrologist but has not had access placed for dialysis.  Repeat potassium has come back greater than 7.5.  This was done at the start of treatment.  We will need to repeat potassium 1 more time to see if there has been any response.  Critical care team has been down to admit the patient.  CRITICAL CARE Performed by: Delora Fuel Total critical care time: 90 minutes Critical care time was exclusive of separately billable procedures and treating other patients. Critical care was necessary to treat or prevent imminent or life-threatening deterioration. Critical care was time spent personally by me on the following activities: development of treatment plan with patient and/or surrogate as well as nursing, discussions with consultants, evaluation of patient's response to treatment, examination of patient, obtaining history from patient or surrogate, ordering and performing treatments and interventions, ordering and review of laboratory studies, ordering and  review of radiographic studies, pulse oximetry and re-evaluation of patient's condition.   Delora Fuel, MD 36/46/80 [phone]

## 2019-12-20 NOTE — Progress Notes (Signed)
  Echocardiogram 2D Echocardiogram has been performed.  Emma Patton [name] 12/20/2019, 10:17 AM

## 2019-12-20 NOTE — Consult Note (Signed)
[facility]  INPATIENT CONSULTATION  Reason for Consultation: AKI Requesting Provider: Dr. Loanne Drilling  HPI: Emma Patton is an 49 y.o. female with DM type 2, HTN, HL, GERD, CKD, chronic pain who is seen for evaluation and management of AKI on CKD in setting of suspected unintentional opioid overdose.   Pt 'found down' by spouse yesterday, responded to narcan by EMS.  Brought to ED and labs showing BUN 61, Cr 3.7, K 7.3.  CXR with congestion.  K temporized.  Trial of IV lasix overnight - oliguric.  Team this AM thought looked intravasc depleted and did NS 566m fluid challenge.  Has made 1732mUOP since. TTE limited but looks grossly normal; didn't get a look at IVC.  UA 1+ protein o/w ok. Renal [place] 11, L 11.2cm; normal echogen; normal. She's being treated with vanc for possible foot osteomyelitis.    Tells me has been taking BID NSAIDs for foot pain lately, 1 day of emesis, orthopnea lately but no swelling.  +constipation.  No hematuria or dysuria lately.    PMH: CKD, DM, GERD, Lyme disease, Bacteremia, Osteomyelitis of the spine, HTN.   Medications:  I have reviewed the patient's current medications.  Medications Prior to Admission  Medication Sig Dispense Refill  . amLODipine (NORVASC) 5 MG tablet Take 5 mg by mouth daily.    . furosemide (LASIX) 40 MG tablet Take 40 mg by mouth.    . gabapentin (NEURONTIN) 600 MG tablet Take 600 mg by mouth.    . linaclotide (LINZESS) 145 MCG CAPS capsule Take by mouth.    . metoCLOPramide (REGLAN) 10 MG tablet Take 10 mg by mouth.    . Marland Kitchenmeprazole (PRILOSEC) 20 MG capsule Take 20 mg by mouth.    . oxyCODONE-acetaminophen (PERCOCET) 10-325 MG tablet     . pregabalin (LYRICA) 75 MG capsule Take 75 mg by mouth.    . sitaGLIPtin (JANUVIA) 100 MG tablet Take 100 mg by mouth.    . traZODone (DESYREL) 100 MG tablet Take 100 mg by mouth.    . zolpidem (AMBIEN) 10 MG tablet Take 10 mg by mouth.      ALLERGIES:   Allergies  Allergen  Reactions  . Penicillins Rash and Hives    FAM HX: No family history on file.  Social History:   has an unknown smoking status. She has never used smokeless tobacco. No history on file for alcohol and drug.  ROS: 12 system ROS neg except per HPI above  Blood pressure (!) 97/50, pulse 72, temperature 97.8 F (36.6 C), temperature source Oral, resp. rate (!) 24, height 6' 2"  (1.88 m), weight (!) 145.4 kg, SpO2 93 %. PHYSICAL EXAM: Gen: obese, nontoxic appearing  Eyes: anicteric, EOMI ENT: MM tacky Neck: supple, unable to check JVD with habitus CV:  RRR Abd:  Soft, obese Lungs: sl ^ WOB with NRB GU: foley with amber urine in bag Extr: trace LE edema, R foot with dressing across MTPs  Neuro: AOx 3, conversant but a little sleepy Skin: cool and dry   Results for orders placed or performed during the hospital encounter of 12/19/19 (from the past 48 hour(s))  Comprehensive metabolic panel     Status: Abnormal   Collection Time: 12/19/19  9:25 PM  Result Value Ref Range   Sodium 135 135 - 145 mmol/L   Potassium >7.5 (HH) 3.5 - 5.1 mmol/L    Comment: NO VISIBLE HEMOLYSIS CRITICAL RESULT CALLED TO, READ BACK BY AND VERIFIED WITH: [address]  RN [id number] M GARRETT    Chloride 101 98 - 111 mmol/L   CO2 25 22 - 32 mmol/L   Glucose, Bld 305 (H) 70 - 99 mg/dL    Comment: Glucose reference range applies only to samples taken after fasting for at least 8 hours.   BUN 56 (H) 6 - 20 mg/dL   Creatinine, Ser 3.52 (H) 0.44 - 1.00 mg/dL   Calcium 7.9 (L) 8.9 - 10.3 mg/dL   Total Protein 6.9 6.5 - 8.1 g/dL   Albumin 3.2 (L) 3.5 - 5.0 g/dL   AST 179 (H) 15 - 41 U/L   ALT 211 (H) 0 - 44 U/L   Alkaline Phosphatase 171 (H) 38 - 126 U/L   Total Bilirubin 1.0 0.3 - 1.2 mg/dL   GFR calc non Af Amer 14 (L) >60 mL/min   GFR calc Af Amer 17 (L) >60 mL/min   Anion gap 9 5 - 15    Comment: Performed at [facility] [address] [place], [place] [address]  Brain natriuretic peptide      Status: Abnormal   Collection Time: 12/19/19  9:25 PM  Result Value Ref Range   B Natriuretic Peptide 296.5 (H) 0.0 - 100.0 pg/mL    Comment: Performed at [facility] [address] [place], [place] [address]  Troponin I (High Sensitivity)     Status: Abnormal   Collection Time: 12/19/19  9:25 PM  Result Value Ref Range   Troponin I (High Sensitivity) 68 (H) <18 ng/L    Comment: (NOTE) Elevated high sensitivity troponin I (hsTnI) values and significant  changes across serial measurements may suggest ACS but many other  chronic and acute conditions are known to elevate hsTnI results.  Refer to the Links section for chest pain algorithms and additional  guidance. Performed at [facility], [place] [address] [place], [place] [address]   CBC with Differential     Status: Abnormal   Collection Time: 12/19/19  9:25 PM  Result Value Ref Range   WBC 17.6 (H) 4.0 - 10.5 K/uL   RBC 3.61 (L) 3.87 - 5.11 MIL/uL   Hemoglobin 10.7 (L) 12.0 - 15.0 g/dL   HCT 35.7 (L) 36.0 - 46.0 %   MCV 98.9 80.0 - 100.0 fL   MCH 29.6 26.0 - 34.0 pg   MCHC 30.0 30.0 - 36.0 g/dL   RDW 14.8 11.5 - 15.5 %   Platelets 292 150 - 400 K/uL   nRBC 1.1 (H) 0.0 - 0.2 %   Neutrophils Relative % 90 %   Neutro Abs 15.7 (H) 1.7 - 7.7 K/uL   Lymphocytes Relative 4 %   Lymphs Abs 0.7 0.7 - 4.0 K/uL   Monocytes Relative 4 %   Monocytes Absolute 0.8 0.1 - 1.0 K/uL   Eosinophils Relative 0 %   Eosinophils Absolute 0.0 0.0 - 0.5 K/uL   Basophils Relative 0 %   Basophils Absolute 0.0 0.0 - 0.1 K/uL   Immature Granulocytes 2 %   Abs Immature Granulocytes 0.37 (H) 0.00 - 0.07 K/uL    Comment: Performed at [facility] [address] [place],  [address]  Ethanol     Status: None   Collection Time: 12/19/19  9:25 PM  Result Value Ref Range   Alcohol, Ethyl (B) <10 <10 mg/dL    Comment: (NOTE) Lowest detectable limit for serum alcohol is 10 mg/dL. For medical purposes only. Performed at [facility], [place] [address], [place], [place] [address]   POC SARS Coronavirus 2 Ag-ED - Nasal Swab (BD Veritor Kit)     Status: None   Collection Time: 12/19/19 10:56 PM  Result Value Ref Range   SARS Coronavirus 2 Ag NEGATIVE NEGATIVE    Comment: (NOTE) SARS-CoV-2 antigen NOT DETECTED.  Negative results are presumptive.  Negative results do not preclude SARS-CoV-2 infection and should not be used as the sole basis for treatment or other patient management decisions, including infection  control decisions, particularly in the presence of clinical signs and  symptoms consistent with COVID-19, or in those who have been in contact with the virus.  Negative results must be combined with clinical observations, patient history, and epidemiological information. The expected result is Negative. Fact Sheet for Patients: PodPark.tn Fact Sheet for Healthcare Providers: GiftContent.is This test is not yet approved or cleared by the [place] FDA and  has been authorized for detection and/or diagnosis of SARS-CoV-2 by FDA under an Emergency Use Authorization (EUA).  This EUA will remain in effect (meaning this test can be used) for the duration of  the COVID-19 de claration under Section 564(b)(1) of the Act, 21 U.S.C. section 360bbb-3(b)(1), unless the authorization is terminated or revoked sooner.   Urine rapid drug screen (hosp performed)     Status: Abnormal   Collection Time: 12/19/19 11:10 PM  Result Value Ref Range   Opiates POSITIVE (A) NONE DETECTED   Cocaine NONE DETECTED NONE DETECTED   Benzodiazepines NONE DETECTED NONE DETECTED   Amphetamines NONE DETECTED NONE DETECTED   Tetrahydrocannabinol NONE DETECTED NONE DETECTED   Barbiturates NONE DETECTED NONE DETECTED    Comment: (NOTE) DRUG SCREEN FOR MEDICAL PURPOSES ONLY.  IF CONFIRMATION IS NEEDED FOR ANY PURPOSE, NOTIFY LAB WITHIN 5 DAYS. LOWEST DETECTABLE  LIMITS FOR URINE DRUG SCREEN Drug Class                     Cutoff (ng/mL) Amphetamine and metabolites    1000 Barbiturate and metabolites    200 Benzodiazepine                 071 Tricyclics and metabolites     300 Opiates and metabolites        300 Cocaine and metabolites        300 THC                            50 Performed at [facility], [place] [address], [place], [place] [address]   Urinalysis, Routine w reflex microscopic     Status: Abnormal   Collection Time: 12/19/19 11:10 PM  Result Value Ref Range   Color, Urine YELLOW YELLOW   APPearance HAZY (A) CLEAR   Specific Gravity, Urine 1.016 1.005 - 1.030   pH 5.0 5.0 - 8.0   Glucose, UA 50 (A) NEGATIVE mg/dL   Hgb urine dipstick NEGATIVE NEGATIVE   Bilirubin Urine NEGATIVE NEGATIVE   Ketones, ur NEGATIVE NEGATIVE mg/dL   Protein, ur 30 (A) NEGATIVE mg/dL   Nitrite NEGATIVE NEGATIVE   Leukocytes,Ua NEGATIVE NEGATIVE   RBC / HPF 0-5 0 - 5 RBC/hpf   WBC, UA 0-5 0 - 5 WBC/hpf   Bacteria, UA RARE (A) NONE SEEN   Squamous Epithelial / LPF 0-5 0 - 5   Mucus PRESENT    Hyaline Casts, UA PRESENT    Amorphous Crystal PRESENT  Comment: Performed at [facility], [place] [address], [place], [place] [address]  SARS CORONAVIRUS 2 (TAT 6-24 HRS) Nasopharyngeal Nasopharyngeal Swab     Status: None   Collection Time: 12/19/19 11:29 PM   Specimen: Nasopharyngeal Swab  Result Value Ref Range   SARS Coronavirus 2 NEGATIVE NEGATIVE    Comment: (NOTE) SARS-CoV-2 target nucleic acids are NOT DETECTED. The SARS-CoV-2 RNA is generally detectable in upper and lower respiratory specimens during the acute phase of infection. Negative results do not preclude SARS-CoV-2 infection, do not rule out co-infections with other pathogens, and should not be used as the sole basis for treatment or other patient management decisions. Negative results must be combined with clinical observations, patient history, and epidemiological  information. The expected result is Negative. Fact Sheet for Patients: SugarRoll.be Fact Sheet for Healthcare Providers: https://www.woods-mathews.com/ This test is not yet approved or cleared by the [place] FDA and  has been authorized for detection and/or diagnosis of SARS-CoV-2 by FDA under an Emergency Use Authorization (EUA). This EUA will remain  in effect (meaning this test can be used) for the duration of the COVID-19 declaration under Section 56 4(b)(1) of the Act, 21 U.S.C. section 360bbb-3(b)(1), unless the authorization is terminated or revoked sooner. Performed at [facility], [place] [address] [place], [place] [address]   Respiratory Panel by RT PCR (Flu A&B, Covid) - Nasopharyngeal Swab     Status: None   Collection Time: 12/19/19 11:29 PM   Specimen: Nasopharyngeal Swab  Result Value Ref Range   SARS Coronavirus 2 by RT PCR NEGATIVE NEGATIVE    Comment: (NOTE) SARS-CoV-2 target nucleic acids are NOT DETECTED. The SARS-CoV-2 RNA is generally detectable in upper respiratoy specimens during the acute phase of infection. The lowest concentration of SARS-CoV-2 viral copies this assay can detect is 131 copies/mL. A negative result does not preclude SARS-Cov-2 infection and should not be used as the sole basis for treatment or other patient management decisions. A negative result may occur with  improper specimen collection/handling, submission of specimen other than nasopharyngeal swab, presence of viral mutation(s) within the areas targeted by this assay, and inadequate number of viral copies (<131 copies/mL). A negative result must be combined with clinical observations, patient history, and epidemiological information. The expected result is Negative. Fact Sheet for Patients:  PinkCheek.be Fact Sheet for Healthcare Providers:  GravelBags.it This test is not  yet ap proved or cleared by the [place] FDA and  has been authorized for detection and/or diagnosis of SARS-CoV-2 by FDA under an Emergency Use Authorization (EUA). This EUA will remain  in effect (meaning this test can be used) for the duration of the COVID-19 declaration under Section 564(b)(1) of the Act, 21 U.S.C. section 360bbb-3(b)(1), unless the authorization is terminated or revoked sooner.    Influenza A by PCR NEGATIVE NEGATIVE   Influenza B by PCR NEGATIVE NEGATIVE    Comment: (NOTE) The Xpert Xpress SARS-CoV-2/FLU/RSV assay is intended as an aid in  the diagnosis of influenza from Nasopharyngeal swab specimens and  should not be used as a sole basis for treatment. Nasal washings and  aspirates are unacceptable for Xpert Xpress SARS-CoV-2/FLU/RSV  testing. Fact Sheet for Patients: PinkCheek.be Fact Sheet for Healthcare Providers: GravelBags.it This test is not yet approved or cleared by the [place] FDA and  has been authorized for detection and/or diagnosis of SARS-CoV-2 by  FDA under an Emergency Use Authorization (EUA). This EUA will remain  in effect (meaning  this test can be used) for the duration of the  Covid-19 declaration under Section 564(b)(1) of the Act, 21  U.S.C. section 360bbb-3(b)(1), unless the authorization is  terminated or revoked. Performed at [facility], [place] [address], [place], [place] [address]   Troponin I (High Sensitivity)     Status: Abnormal   Collection Time: 12/20/19 12:15 AM  Result Value Ref Range   Troponin I (High Sensitivity) 58 (H) <18 ng/L    Comment: (NOTE) Elevated high sensitivity troponin I (hsTnI) values and significant  changes across serial measurements may suggest ACS but many other  chronic and acute conditions are known to elevate hsTnI results.  Refer to the "Links" section for chest pain algorithms and additional  guidance. Performed at [facility], [place] [address] [place], [place] [address]   Potassium     Status: Abnormal   Collection Time: 12/20/19  2:06 AM  Result Value Ref Range   Potassium >7.5 (HH) 3.5 - 5.1 mmol/L    Comment: CRITICAL RESULT CALLED TO, READ BACK BY AND VERIFIED WITH: Margarette Asal RN [phone] Sander Radon Performed at [facility], [address], [place], [place] [address]   CBG monitoring, ED     Status: Abnormal   Collection Time: 12/20/19  2:13 AM  Result Value Ref Range   Glucose-Capillary 254 (H) 70 - 99 mg/dL    Comment: Glucose reference range applies only to samples taken after fasting for at least 8 hours.  CBG monitoring, ED     Status: Abnormal   Collection Time: 12/20/19  3:22 AM  Result Value Ref Range   Glucose-Capillary 252 (H) 70 - 99 mg/dL    Comment: Glucose reference range applies only to samples taken after fasting for at least 8 hours.  Hepatic function panel     Status: Abnormal   Collection Time: 12/20/19  4:00 AM  Result Value Ref Range   Total Protein 7.4 6.5 - 8.1 g/dL   Albumin 3.5 3.5 - 5.0 g/dL   AST 227 (H) 15 - 41 U/L   ALT 284 (H) 0 - 44 U/L   Alkaline Phosphatase 173 (H) 38 - 126 U/L   Total Bilirubin 0.8 0.3 - 1.2 mg/dL   Bilirubin, Direct 0.2 0.0 - 0.2 mg/dL   Indirect Bilirubin 0.6 0.3 - 0.9 mg/dL    Comment: Performed at [facility] [address], [place], [place] [address]  HIV Antibody (routine testing w rflx)     Status: None   Collection Time: 12/20/19  5:28 AM  Result Value Ref Range   HIV Screen 4th Generation wRfx NON REACTIVE NON REACTIVE    Comment: Performed at [facility] [address], [place], [place] [address]  Magnesium     Status: None   Collection Time: 12/20/19  5:28 AM  Result Value Ref Range   Magnesium 2.2 1.7 - 2.4 mg/dL    Comment: Performed at [facility], [place] [address] [place], [place] [address]  D-dimer, quantitative (not at [facility])     Status: Abnormal   Collection Time: 12/20/19   5:28 AM  Result Value Ref Range   D-Dimer, Quant 8.51 (H) 0.00 - 0.50 ug/mL-FEU    Comment: (NOTE) At the manufacturer cut-off of 0.50 ug/mL FEU, this assay has been documented to exclude PE with a sensitivity and negative predictive value of 97 to 99%.  At this time, this assay has not been  approved by the FDA to exclude DVT/VTE. Results should be correlated with clinical presentation. Performed at [facility], [place] [address] [place], [place] [address]   Protime-INR     Status: None   Collection Time: 12/20/19  5:28 AM  Result Value Ref Range   Prothrombin Time 15.0 11.4 - 15.2 seconds   INR 1.2 0.8 - 1.2    Comment: (NOTE) INR goal varies based on device and disease states. Performed at [facility], [place] [address] [place], [place] [address]   C-reactive protein     Status: Abnormal   Collection Time: 12/20/19  5:28 AM  Result Value Ref Range   CRP 7.1 (H) <1.0 mg/dL    Comment: Performed at [facility] [address], [place], [place] [address]  Basic metabolic panel     Status: Abnormal   Collection Time: 12/20/19  5:28 AM  Result Value Ref Range   Sodium 135 135 - 145 mmol/L   Potassium 6.6 (HH) 3.5 - 5.1 mmol/L    Comment: CRITICAL RESULT CALLED TO, READ BACK BY AND VERIFIED WITH: Sonia Side RN [phone] M GARRETT    Chloride 100 98 - 111 mmol/L   CO2 23 22 - 32 mmol/L   Glucose, Bld 261 (H) 70 - 99 mg/dL    Comment: Glucose reference range applies only to samples taken after fasting for at least 8 hours.   BUN 59 (H) 6 - 20 mg/dL   Creatinine, Ser 3.70 (H) 0.44 - 1.00 mg/dL   Calcium 8.2 (L) 8.9 - 10.3 mg/dL   GFR calc non Af Amer 13 (L) >60 mL/min   GFR calc Af Amer 16 (L) >60 mL/min   Anion gap 12 5 - 15    Comment: Performed at [facility] [address], [place], [place] [address]  Procalcitonin     Status: None   Collection Time: 12/20/19  5:28 AM  Result Value Ref Range   Procalcitonin 1.32 ng/mL    Comment:         Interpretation: PCT > 0.5 ng/mL and <= 2 ng/mL: Systemic infection (sepsis) is possible, but other conditions are known to elevate PCT as well. (NOTE)       Sepsis PCT Algorithm           Lower Respiratory Tract                                      Infection PCT Algorithm    ----------------------------     ----------------------------         PCT < 0.25 ng/mL                PCT < 0.10 ng/mL         Strongly encourage             Strongly discourage   discontinuation of antibiotics    initiation of antibiotics    ----------------------------     -----------------------------       PCT 0.25 - 0.50 ng/mL            PCT 0.10 - 0.25 ng/mL               OR       >80% decrease in PCT            Discourage initiation of  antibiotics      Encourage discontinuation           of antibiotics    ----------------------------     -----------------------------         PCT >= 0.50 ng/mL              PCT 0.26 - 0.50 ng/mL                AND       <80% decrease in PCT             Encourage initiation of                                             antibiotics       Encourage continuation           of antibiotics    ----------------------------     -----------------------------        PCT >= 0.50 ng/mL                  PCT > 0.50 ng/mL               AND         increase in PCT                  Strongly encourage                                      initiation of antibiotics    Strongly encourage escalation           of antibiotics                                     -----------------------------                                           PCT <= 0.25 ng/mL                                                 OR                                        > 80% decrease in PCT                                     Discontinue / Do not initiate                                             antibiotics Performed at [facility], [place] [address] [place],  [place] [address]   Hemoglobin A1c     Status: Abnormal   Collection Time: 12/20/19  5:28 AM  Result Value Ref Range   Hgb A1c MFr Bld 10.9 (H) 4.8 - 5.6 %    Comment: (NOTE) Pre diabetes:          5.7%-6.4% Diabetes:              >6.4% Glycemic control for   <7.0% adults with diabetes    Mean Plasma Glucose 266.13 mg/dL    Comment: Performed at [facility] [address] [place], [place] [address]  MRSA PCR Screening     Status: Abnormal   Collection Time: 12/20/19  5:56 AM   Specimen: Nasopharyngeal  Result Value Ref Range   MRSA by PCR POSITIVE (A) NEGATIVE    Comment:        The GeneXpert MRSA Assay (FDA approved for NASAL specimens only), is one component of a comprehensive MRSA colonization surveillance program. It is not intended to diagnose MRSA infection nor to guide or monitor treatment for MRSA infections. RESULT CALLED TO, READ BACK BY AND VERIFIED WITH: ROBERTS,R RN 12/20/2019 AT 0706 SKEEN,P   Basic metabolic panel     Status: Abnormal   Collection Time: 12/20/19  7:15 AM  Result Value Ref Range   Sodium 136 135 - 145 mmol/L   Potassium 7.3 (HH) 3.5 - 5.1 mmol/L    Comment: NO VISIBLE HEMOLYSIS CRITICAL RESULT CALLED TO, READ BACK BY AND VERIFIED WITH: S ROBERTS,RN 12/20/2019 0756 WILDERK    Chloride 102 98 - 111 mmol/L   CO2 22 22 - 32 mmol/L   Glucose, Bld 259 (H) 70 - 99 mg/dL    Comment: Glucose reference range applies only to samples taken after fasting for at least 8 hours.   BUN 61 (H) 6 - 20 mg/dL   Creatinine, Ser 3.76 (H) 0.44 - 1.00 mg/dL   Calcium 7.9 (L) 8.9 - 10.3 mg/dL   GFR calc non Af Amer 13 (L) >60 mL/min   GFR calc Af Amer 15 (L) >60 mL/min   Anion gap 12 5 - 15    Comment: Performed at [facility] [address] [place], [place] [address]  Glucose, capillary     Status: Abnormal   Collection Time: 12/20/19  8:22 AM  Result Value Ref Range   Glucose-Capillary 217 (H) 70 - 99 mg/dL    Comment: Glucose reference range  applies only to samples taken after fasting for at least 8 hours.  I-STAT 7, (LYTES, BLD GAS, ICA, H+H)     Status: Abnormal   Collection Time: 12/20/19  8:23 AM  Result Value Ref Range   pH, Arterial 7.216 (L) 7.350 - 7.450   pCO2 arterial 64.4 (H) 32.0 - 48.0 mmHg   pO2, Arterial 77.0 (L) 83.0 - 108.0 mmHg   Bicarbonate 26.3 20.0 - 28.0 mmol/L   TCO2 28 22 - 32 mmol/L   O2 Saturation 92.0 %   Acid-base deficit 3.0 (H) 0.0 - 2.0 mmol/L   Sodium 134 (L) 135 - 145 mmol/L   Potassium 6.8 (HH) 3.5 - 5.1 mmol/L   Calcium, Ion 1.14 (L) 1.15 - 1.40 mmol/L   HCT 34.0 (L) 36.0 - 46.0 %   Hemoglobin 11.6 (L) 12.0 - 15.0 g/dL   Patient temperature 97.8 F    Collection site RADIAL, ALLEN'S TEST ACCEPTABLE    Drawn by RT    Sample type ARTERIAL    Comment NOTIFIED PHYSICIAN   Glucose, capillary     Status: Abnormal   Collection Time: 12/20/19 11:50  AM  Result Value Ref Range   Glucose-Capillary 169 (H) 70 - 99 mg/dL    Comment: Glucose reference range applies only to samples taken after fasting for at least 8 hours.    CT Head Wo Contrast  Result Date: 12/20/2019 CLINICAL DATA:  Altered mental status EXAM: CT HEAD WITHOUT CONTRAST TECHNIQUE: Contiguous axial images were obtained from the base of the skull through the vertex without intravenous contrast. COMPARISON:  None. FINDINGS: Brain: No evidence of acute territorial infarction, hemorrhage, hydrocephalus,extra-axial collection or mass lesion/mass effect. The ventricles are normal in size and contour. Low-attenuation changes in the deep white matter consistent with small vessel ischemia. Vascular: No hyperdense vessel or unexpected calcification. Skull: The skull is intact. No fracture or focal lesion identified. Sinuses/Orbits: The visualized paranasal sinuses and mastoid air cells are clear. The orbits and globes intact. Other: None IMPRESSION: No acute intracranial abnormality. Findings consistent with chronic small vessel ischemia  Electronically Signed   By: Prudencio Pair M.D.   On: 12/20/2019 00:36   US RENAL  Result Date: 12/20/2019 CLINICAL DATA:  Acute kidney injury EXAM: RENAL / URINARY TRACT ULTRASOUND COMPLETE COMPARISON:  None. FINDINGS: Right Kidney: Renal measurements: 11.0 x 6.9 x 5.4 cm = volume: 211 mL . Echogenicity within normal limits. No mass or hydronephrosis visualized. Left Kidney: Renal measurements: 11.2 x 6.3 x 6.2 cm = volume: 227 mL. Echogenicity within normal limits. No mass or hydronephrosis visualized. Bladder: Decompressed Other: None. IMPRESSION: Normal appearing kidneys.  Decompressed bladder Electronically Signed   By: Prudencio Pair M.D.   On: 12/20/2019 04:37   DG Chest Portable 1 View  Result Date: 12/19/2019 CLINICAL DATA:  Altered mental status EXAM: PORTABLE CHEST 1 VIEW COMPARISON:  None. FINDINGS: Cardiomegaly, vascular congestion. Left perihilar airspace opacity. No visible effusions. No acute bony abnormality. IMPRESSION: Cardiomegaly with vascular congestion. Left perihilar airspace disease could reflect asymmetric edema or pneumonia. Electronically Signed   By: Rolm Baptise M.D.   On: 12/19/2019 21:57   DG Foot 2 Views Right  Result Date: 12/20/2019 CLINICAL DATA:  Chronic right foot wound EXAM: RIGHT FOOT - 2 VIEW COMPARISON:  None. FINDINGS: There is no evidence of fracture or dislocation. Soft tissue swelling and ulceration overlying the medial aspect of the foot at the level of the great toe proximal phalanx and first MTP joint. There is subtle cortical irregularity along the medial cortex of the great toe proximal phalanx. No well-defined erosive change. Mild hallux valgus. Mild diffuse soft tissue swelling. Prominent vascular calcifications, advanced for age. IMPRESSION: Soft tissue swelling and ulceration overlying the medial aspect of the great toe. There is subtle cortical irregularity along the medial cortex of the great toe proximal phalanx without well-defined erosive change.  Findings may reflect sequela of acute or chronic infection. If clinically indicated, MRI could be performed for further evaluation. Electronically Signed   By: Davina Poke D.O.   On: 12/20/2019 11:43   ECHOCARDIOGRAM COMPLETE  Result Date: 12/20/2019    ECHOCARDIOGRAM REPORT   Patient Name:   MAYVIS AGUDELO Date of Exam: 12/20/2019 Medical Rec #:  [id number]            Height:       74.0 in Accession #:    [id number]           Weight:       320.5 lb Date of Birth:  09-24-1971            BSA:  2.656 m Patient Age:    89 years             BP:           105/57 mmHg Patient Gender: F                    HR:           79 bpm. Exam Location:  Inpatient Procedure: 2D Echo and Intracardiac Opacification Agent Indications:    Acute Respiratory Insufficiency 518.82 / R06.89  History:        Patient has no prior history of Echocardiogram examinations.                 Arrythmias:Atrial Fibrillation; Risk Factors:Diabetes and                 Hypertension. Acute kidney injury.  Sonographer:    Vikki Ports Turrentine Referring Phys: [id number] Silvestre Moment [facility]  Sonographer Comments: Suboptimal subcostal window and Technically difficult study due to poor echo windows. Image acquisition challenging due to patient body habitus. IMPRESSIONS  1. Left ventricular ejection fraction, by estimation, is 55 to 60%. The left ventricle has normal function. The left ventricle has no regional wall motion abnormalities. Left ventricular diastolic parameters were normal.  2. Right ventricular systolic function is normal. The right ventricular size is normal. Tricuspid regurgitation signal is inadequate for assessing PA pressure.  3. The mitral valve is grossly normal. No evidence of mitral valve regurgitation. No evidence of mitral stenosis.  4. The aortic valve is tricuspid. Aortic valve regurgitation is not visualized. No aortic stenosis is present. FINDINGS  Left Ventricle: Left ventricular ejection fraction, by estimation, is 55 to  60%. The left ventricle has normal function. The left ventricle has no regional wall motion abnormalities. Definity contrast agent was given IV to delineate the left ventricular  endocardial borders. The left ventricular internal cavity size was normal in size. There is no left ventricular hypertrophy. Left ventricular diastolic parameters were normal. Normal left ventricular filling pressure. Right Ventricle: The right ventricular size is normal. No increase in right ventricular wall thickness. Right ventricular systolic function is normal. Tricuspid regurgitation signal is inadequate for assessing PA pressure. Left Atrium: Left atrial size was normal in size. Right Atrium: Right atrial size was normal in size. Pericardium: Trivial pericardial effusion is present. The pericardial effusion is posterior to the left ventricle. Presence of pericardial fat pad. Mitral Valve: The mitral valve is grossly normal. Mild mitral annular calcification. No evidence of mitral valve regurgitation. No evidence of mitral valve stenosis. Tricuspid Valve: The tricuspid valve is grossly normal. Tricuspid valve regurgitation is trivial. No evidence of tricuspid stenosis. Aortic Valve: The aortic valve is tricuspid. Aortic valve regurgitation is not visualized. No aortic stenosis is present. Pulmonic Valve: The pulmonic valve was grossly normal. Pulmonic valve regurgitation is not visualized. No evidence of pulmonic stenosis. Aorta: The aortic root is normal in size and structure. Venous: IVC assessment for right atrial pressure unable to be performed due to mechanical ventilation. IAS/Shunts: No atrial level shunt detected by color flow Doppler.  LEFT VENTRICLE PLAX 2D LVIDd:         5.80 cm  Diastology LVIDs:         3.80 cm  LV e' lateral:   10.90 cm/s LV PW:         1.00 cm  LV E/e' lateral: 10.4 LV IVS:        1.00 cm  LV e' medial:  5.93 cm/s LVOT diam:     2.40 cm  LV E/e' medial:  19.1 LV SV:         105 LV SV Index:   40 LVOT  Area:     4.52 cm  RIGHT VENTRICLE RV S prime:     15.40 cm/s TAPSE (M-mode): 2.5 cm LEFT ATRIUM             Index       RIGHT ATRIUM           Index LA diam:        4.80 cm 1.81 cm/m  RA Area:     18.20 cm LA Vol (A2C):   81.1 ml 30.54 ml/m RA Volume:   47.10 ml  17.73 ml/m LA Vol (A4C):   74.3 ml 27.97 ml/m LA Biplane Vol: 80.5 ml 30.31 ml/m  AORTIC VALVE LVOT Vmax:   112.00 cm/s LVOT Vmean:  86.700 cm/s LVOT VTI:    0.233 m  AORTA Ao Root diam: 3.10 cm MITRAL VALVE MV Area (PHT): 2.99 cm     SHUNTS MV Decel Time: 254 msec     Systemic VTI:  0.23 m MV E velocity: 113.00 cm/s  Systemic Diam: 2.40 cm MV A velocity: 89.70 cm/s MV E/A ratio:  1.26 Eleonore Chiquito MD Electronically signed by Eleonore Chiquito MD Signature Date/Time: 12/20/2019/12:09:51 PM    Final    VAS [place] LOWER EXTREMITY VENOUS (DVT)  Result Date: 12/20/2019  Lower Venous DVTStudy Indications: Edema.  Limitations: Body habitus and poor ultrasound/tissue interface. Comparison Study: No prior exam. Performing Technologist: Baldwin Crown ARDMS, RVT  Examination Guidelines: A complete evaluation includes B-mode imaging, spectral Doppler, color Doppler, and power Doppler as needed of all accessible portions of each vessel. Bilateral testing is considered an integral part of a complete examination. Limited examinations for reoccurring indications may be performed as noted. The reflux portion of the exam is performed with the patient in reverse Trendelenburg.  +---------+---------------+---------+-----------+----------+------------------+ RIGHT    CompressibilityPhasicitySpontaneityPropertiesThrombus Aging     +---------+---------------+---------+-----------+----------+------------------+ CFV      Full           Yes      Yes                                     +---------+---------------+---------+-----------+----------+------------------+ SFJ      Full                                                             +---------+---------------+---------+-----------+----------+------------------+ FV Prox  Full                                                            +---------+---------------+---------+-----------+----------+------------------+ FV Mid   Full                                         visualized with  color              +---------+---------------+---------+-----------+----------+------------------+ FV DistalFull                                         visualized with                                                          color              +---------+---------------+---------+-----------+----------+------------------+ PFV      Full                                                            +---------+---------------+---------+-----------+----------+------------------+ POP      Full           Yes      Yes                                     +---------+---------------+---------+-----------+----------+------------------+ PTV      Full                                         visualized with                                                          color              +---------+---------------+---------+-----------+----------+------------------+ PERO     Full                                         visualized with                                                          color              +---------+---------------+---------+-----------+----------+------------------+   +---------+---------------+---------+-----------+----------+------------------+ LEFT     CompressibilityPhasicitySpontaneityPropertiesThrombus Aging     +---------+---------------+---------+-----------+----------+------------------+ CFV      Full           Yes      Yes                                     +---------+---------------+---------+-----------+----------+------------------+ SFJ      Full                                                             +---------+---------------+---------+-----------+----------+------------------+  FV Prox  Full                                                            +---------+---------------+---------+-----------+----------+------------------+ FV Mid   Full                                                            +---------+---------------+---------+-----------+----------+------------------+ FV DistalFull                                         visualized with                                                          color              +---------+---------------+---------+-----------+----------+------------------+ PFV      Full                                                            +---------+---------------+---------+-----------+----------+------------------+ POP      Full           Yes      Yes                                     +---------+---------------+---------+-----------+----------+------------------+ PTV      Full                                         visualized with                                                          color              +---------+---------------+---------+-----------+----------+------------------+ PERO     Full                                         visualized with                                                          color              +---------+---------------+---------+-----------+----------+------------------+  Summary: RIGHT: - There is no evidence of deep vein thrombosis in the lower extremity. However, portions of this examination were limited- see technologist comments above.  - No cystic structure found in the popliteal fossa.  LEFT: - There is no evidence of deep vein thrombosis in the lower extremity. However, portions of this examination were limited- see technologist comments above.  - No cystic structure found in the popliteal fossa.  *See  table(s) above for measurements and observations. Electronically signed by Deitra Mayo MD on 12/20/2019 at 1:06:02 PM.    Final     Assessment/Plan **AKI on CKD: per report has CKD but last known Cr 1.5 in 2018. Here with presumed AKI.  Suspect multifactorial with NSAIDs, ARB, hypotension with LOC episode, possible rhabdo (CK pending).  UA and renal US ok.  Had 1 day of emesis but has had edema and orthopnea lately.  We are checking urine electrolytes but favor hypervolemia so will diurese her now.  Close to needing HD for K, she is agreeable if needed.  **Hyperkalemia, severe: has had modest improvement with medical management.  Making a bit of urine now so will use high dose lasix + lokelma now to try to medically manage and avert need for dialysis.  If K trending up will end up needing HD.  Check CK.   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  Follow vanc levels closely with AKI.   **Hypoxic resp failure: on NRB currently with pulm edema on CXR; diuresis per above.    **HTN:  On outpt meds, currently hypotensive.   **DM: per primary.  Poorly controlled at baseline.   Justin Mend 12/20/2019, 2:22 PM

## 2019-12-20 NOTE — ED Notes (Signed)
Pt to CT at this time.

## 2019-12-20 NOTE — Progress Notes (Signed)
Pharmacy Antibiotic Note  Emma Patton is a 49 y.o. female admitted on 12/19/2019 with AMS.  She was found down by her husband after taking Percocet and responded to Narcan in the field.  Pharmacy has been consulted for vancomycin dosing for osteomyelitis.  SCr 3.76, CrCL 30 mlmin, afebrile, WBC 17.6, CRP 7.1.  Plan: Vanc 2500mg  IV x 1 Monitor renal fxn, clinical progress, vanc level as indicated  Height: 6\' 2"  (188 cm) Weight: (!) 320 lb 8.8 oz (145.4 kg) IBW/kg (Calculated) : 77.7  Temp (24hrs), Avg:98.1 F (36.7 C), Min:97.8 F (36.6 C), Max:98.3 F (36.8 C)  Recent Labs  Lab 12/19/19 2125 12/20/19 0528 12/20/19 0715  WBC 17.6*  --   --   CREATININE 3.52* 3.70* 3.76*    Estimated Creatinine Clearance: 29.6 mL/min (A) (by C-G formula based on SCr of 3.76 mg/dL (H)).    Allergies  Allergen Reactions  . Penicillins Rash and Hives    Vanc 3/23 >>  3/22 covid / flu - negative 3/23 MRSA PCR - positive  Emma Patton, PharmD, BCPS, [place] 12/20/2019, 10:32 AM

## 2019-12-20 NOTE — Progress Notes (Signed)
PCCM progress note   Patient admitted this morning for acute hypoxic respiratory failure with acute renal failure. 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  Critical care was necessary to treat or prevent imminent or life-threatening deterioration.  Critical care was time spent personally by me on the following activities: development of treatment plan with patient and/or surrogate as well as nursing, discussions with consultants, evaluation of patient's response to treatment, examination of patient, obtaining history from patient or surrogate, ordering and performing treatments and interventions, ordering and review of laboratory studies, ordering and review of radiographic studies, pulse oximetry and re-evaluation of patient's condition. ] Johnsie Cancel, NP-C [facility] Contact / Pager information can be found on Amion  12/20/2019, 10:57 AM

## 2019-12-20 NOTE — Procedures (Signed)
Hemodialysis Catheter Insertion Procedure Note Emma Patton [id number] 1971/09/29  Procedure: Insertion of Hemodialysis Catheter Indications: Dialysis Access   Procedure Details Consent: Risks of procedure as well as the alternatives and risks of each were explained to the (patient/caregiver).  Consent for procedure obtained. Time Out: Verified patient identification, verified procedure, site/side was marked, verified correct patient position, special equipment/implants available, medications/allergies/relevent history reviewed, required imaging and test results available.  Performed  Maximum sterile technique was used including antiseptics, cap, gloves, gown, hand hygiene, mask and sheet. Skin prep: Chlorhexidine x3; local anesthetic administered Triple lumen hemodialysis catheter was inserted into right internal jugular vein using the Seldinger technique.  Evaluation Blood flow good Complications: No apparent complications Patient did tolerate procedure well. Chest X-ray ordered to verify placement.  CXR: pending.   Johnsie Cancel, NP-C [facility] Contact / Pager information can be found on Amion  12/20/2019, 7:03 PM

## 2019-12-20 NOTE — Progress Notes (Signed)
Bilateral lower extremity venous duplex exam completed.  Preliminary results can be found under CV proc under chart review.  12/20/2019 12:53 PM  Jamont Mellin, K., RDMS, RVT

## 2019-12-20 NOTE — Consult Note (Signed)
[place] Nurse Consult Note: Reason for Consult: Patient consult for ulceration on right great toe. She has diabetes, hypertension and a history of chronic, nonhealing ulcerations on the bilateral feet. History of osteomyelitis of the left foot. She was seen in the outpatient [facility] for local care to a chronic ulceration on the left foot in 2017. She has had a toe amputation on the left foot in 2018 for which she was under the care of Podiatry (Dr. Deretha Emory) for an area that was slow to heal post procedure.  Wound type: Neuropathic Pressure Injury POA: N/A Measurement: Per RN A. Clark earlier today, 2.3cm x 1.8cm x 0.3cm Wound bed: red, dry Drainage (amount, consistency, odor) scant serous Periwound: erythematous, edematous and with poor hygiene Dressing procedure/placement/frequency: I will implement a twice daily saline moistened gauze dressing to the ulcer after washing foot with soap and water and applying a moisturizer to the dry, cracked heels.   Patient would benefit from follow up with her podiatrist, Dr. Genevie Ann, following discharge.  If you agree, please order/arrange.  [place] nursing team will not follow, but will remain available to this patient, the nursing and medical teams.  Please re-consult if needed. Thanks, Maudie Flakes, MSN, RN, [place], Arther Abbott  Pager# [phone]

## 2019-12-20 NOTE — Progress Notes (Signed)
eLink Physician-Brief Progress Note Patient Name: Emma Patton DOB: Oct 19, 1970 MRN: [id number]   Date of Service  12/20/2019  HPI/Events of Note  Pt admitted with acute respiratory failure and altered mental status secondary to suspected narcotic over dose, in the ED she was found to have a serum K+ of > 7.5, creatinine was 3.5, 3 years ago per [facility] it was 1.6-1.8, Pt received standard Rx for hyperkalemia I n the ED, repeat K+ results are pending.  eICU Interventions  New Patient Evaluation completed, will follow up repeat K+ results.        Kerry Kass Naysa Puskas 12/20/2019, 6:15 AM

## 2019-12-20 NOTE — Progress Notes (Signed)
eLink Physician-Brief Progress Note Patient Name: Emma Patton DOB: 01/22/1971 MRN: [id number]   Date of Service  12/20/2019  HPI/Events of Note  K+ 6.6  eICU Interventions  Moderate hyperkalemia order set      Intervention Category Major Interventions: Electrolyte abnormality - evaluation and management  Frederik Pear 12/20/2019, 6:53 AM

## 2019-12-20 NOTE — Progress Notes (Signed)
CRITICAL VALUE ALERT  Critical Value:  K 6.6  Date & Time Notied: 12/20/19 0640  Provider Notified: Warren Lacy  Orders Received/Actions taken: see new orders

## 2019-12-20 NOTE — ED Notes (Addendum)
Date and time results received: 12/20/19 328 (use smartphrase ".now" to insert current time)  Test: k Critical Value: > 7.5  Name of Provider Notified: Roxanne Mins

## 2019-12-20 NOTE — ED Notes (Signed)
Attempted to take pt off non rebreather and place pt on 6L, pt oxygen dropped to 83% on 6L, placed back on NRB, MD notified

## 2019-12-21 DIAGNOSIS — N170 Acute kidney failure with tubular necrosis: Secondary | ICD-10-CM

## 2019-12-21 DIAGNOSIS — G9340 Encephalopathy, unspecified: Secondary | ICD-10-CM

## 2019-12-21 LAB — CBC
HCT: 31.6 % — ABNORMAL LOW (ref 36.0–46.0)
Hemoglobin: 9.7 g/dL — ABNORMAL LOW (ref 12.0–15.0)
MCH: 30.6 pg (ref 26.0–34.0)
MCHC: 30.7 g/dL (ref 30.0–36.0)
MCV: 99.7 fL (ref 80.0–100.0)
Platelets: 245 10*3/uL (ref 150–400)
RBC: 3.17 MIL/uL — ABNORMAL LOW (ref 3.87–5.11)
RDW: 15.4 % (ref 11.5–15.5)
WBC: 9.9 10*3/uL (ref 4.0–10.5)
nRBC: 1.2 % — ABNORMAL HIGH (ref 0.0–0.2)

## 2019-12-21 LAB — RENAL FUNCTION PANEL
Albumin: 2.9 g/dL — ABNORMAL LOW (ref 3.5–5.0)
Albumin: 2.7 g/dL — ABNORMAL LOW (ref 3.5–5.0)
Anion gap: 8 (ref 5–15)
Anion gap: 4 — ABNORMAL LOW (ref 5–15)
BUN: 47 mg/dL — ABNORMAL HIGH (ref 6–20)
BUN: 40 mg/dL — ABNORMAL HIGH (ref 6–20)
CO2: 27 mmol/L (ref 22–32)
CO2: 27 mmol/L (ref 22–32)
Calcium: 7.4 mg/dL — ABNORMAL LOW (ref 8.9–10.3)
Calcium: 7 mg/dL — ABNORMAL LOW (ref 8.9–10.3)
Chloride: 101 mmol/L (ref 98–111)
Chloride: 105 mmol/L (ref 98–111)
Creatinine, Ser: 3.09 mg/dL — ABNORMAL HIGH (ref 0.44–1.00)
Creatinine, Ser: 2.27 mg/dL — ABNORMAL HIGH (ref 0.44–1.00)
GFR calc Af Amer: 19 mL/min — ABNORMAL LOW (ref >60)
GFR calc Af Amer: 28 mL/min — ABNORMAL LOW (ref >60)
GFR calc non Af Amer: 17 mL/min — ABNORMAL LOW (ref >60)
GFR calc non Af Amer: 24 mL/min — ABNORMAL LOW (ref >60)
Glucose, Bld: 129 mg/dL — ABNORMAL HIGH (ref 70–99)
Glucose, Bld: 180 mg/dL — ABNORMAL HIGH (ref 70–99)
Phosphorus: 5 mg/dL — ABNORMAL HIGH (ref 2.5–4.6)
Phosphorus: 3.8 mg/dL (ref 2.5–4.6)
Potassium: 4.6 mmol/L (ref 3.5–5.1)
Potassium: 4.4 mmol/L (ref 3.5–5.1)
Sodium: 136 mmol/L (ref 135–145)
Sodium: 136 mmol/L (ref 135–145)

## 2019-12-21 LAB — GLUCOSE, CAPILLARY
Glucose-Capillary: 207 mg/dL — ABNORMAL HIGH (ref 70–99)
Glucose-Capillary: 123 mg/dL — ABNORMAL HIGH (ref 70–99)
Glucose-Capillary: 119 mg/dL — ABNORMAL HIGH (ref 70–99)
Glucose-Capillary: 166 mg/dL — ABNORMAL HIGH (ref 70–99)
Glucose-Capillary: 169 mg/dL — ABNORMAL HIGH (ref 70–99)
Glucose-Capillary: 191 mg/dL — ABNORMAL HIGH (ref 70–99)
Glucose-Capillary: 217 mg/dL — ABNORMAL HIGH (ref 70–99)

## 2019-12-21 LAB — VANCOMYCIN, RANDOM: Vancomycin Rm: 23

## 2019-12-21 LAB — PROCALCITONIN: Procalcitonin: 1.17 ng/mL

## 2019-12-21 LAB — POTASSIUM: Potassium: 4.5 mmol/L (ref 3.5–5.1)

## 2019-12-21 LAB — MAGNESIUM: Magnesium: 2.2 mg/dL (ref 1.7–2.4)

## 2019-12-21 LAB — SODIUM, URINE, RANDOM: Sodium, Ur: 27 mmol/L

## 2019-12-21 MED ORDER — VANCOMYCIN HCL 1.5 G IV SOLR
1500.0000 mg | INTRAVENOUS | Status: DC
  Filled 2019-12-21: qty 1500

## 2019-12-21 MED ORDER — MELATONIN 3 MG PO TABS
3.0000 mg | ORAL_TABLET | Freq: Every day | ORAL | Status: DC
  Administered 2019-12-21 – 2019-12-23 (×3): 3 mg via ORAL
  Filled 2019-12-21 (×5): qty 1

## 2019-12-21 MED ORDER — POLYETHYLENE GLYCOL 3350 17 G PO PACK
17.0000 g | PACK | Freq: Every day | ORAL | Status: DC
  Administered 2019-12-21 – 2019-12-25 (×5): 17 g via ORAL
  Filled 2019-12-21 (×6): qty 1

## 2019-12-21 MED ORDER — ACETAMINOPHEN 325 MG PO TABS
650.0000 mg | ORAL_TABLET | Freq: Four times a day (QID) | ORAL | Status: DC | PRN
  Administered 2019-12-21 (×2): 650 mg via ORAL
  Filled 2019-12-21 (×2): qty 2

## 2019-12-21 MED ORDER — NON FORMULARY: Freq: Every evening | Status: DC | PRN

## 2019-12-21 MED ORDER — WHITE PETROLATUM EX OINT
TOPICAL_OINTMENT | CUTANEOUS | Status: AC
  Filled 2019-12-21: qty 28.35

## 2019-12-21 MED ORDER — VANCOMYCIN HCL 1500 MG/300ML IV SOLN
1500.0000 mg | INTRAVENOUS | Status: DC
  Administered 2019-12-21: 1500 mg via INTRAVENOUS
  Filled 2019-12-21 (×2): qty 300

## 2019-12-21 MED ORDER — ORAL CARE MOUTH RINSE
15.0000 mL | Freq: Two times a day (BID) | OROMUCOSAL | Status: DC
  Administered 2019-12-21 – 2019-12-25 (×5): 15 mL via OROMUCOSAL

## 2019-12-21 MED ORDER — OXYCODONE HCL 5 MG PO TABS
5.0000 mg | ORAL_TABLET | Freq: Four times a day (QID) | ORAL | Status: DC | PRN
  Administered 2019-12-21 – 2019-12-25 (×10): 5 mg via ORAL
  Filled 2019-12-21 (×10): qty 1

## 2019-12-21 MED ORDER — FUROSEMIDE 10 MG/ML IJ SOLN
80.0000 mg | Freq: Once | INTRAMUSCULAR | Status: AC
  Administered 2019-12-21: 80 mg via INTRAVENOUS
  Filled 2019-12-21: qty 8

## 2019-12-21 MED ORDER — CHLORHEXIDINE GLUCONATE 0.12 % MT SOLN
15.0000 mL | Freq: Two times a day (BID) | OROMUCOSAL | Status: DC
  Administered 2019-12-21 – 2019-12-26 (×10): 15 mL via OROMUCOSAL
  Filled 2019-12-21 (×8): qty 15

## 2019-12-21 NOTE — Progress Notes (Signed)
[facility] Progress Note   Subjective:   Started CRRT yesterday PM for persistent hyperK. Made 553mL UOP yesterday.  Net neg 1.2L for admission with UF on CRRT.  Feeling globally improved.  No new complaints  Objective Vitals:   12/21/19 0731 12/21/19 0800 12/21/19 0830 12/21/19 0837  BP:  (!) 113/54 (!) 82/59 (!) 82/59  Pulse:  73 73   Resp:  18 15   Temp: (!) 97.3 F (36.3 C)     TempSrc: Oral     SpO2:  93% 94%   Weight:      Height:       Physical Exam General: obese, comfortable in bed Heart: RRR, no rub Lungs: clear ant,normal WOB on 12L HFNC Abdomen: soft, obese Extremities: trace edema, R foot with dressing.   Neuro: conversant and oriented, quicker to respond than yesterday Dialysis Access: RIJ temp cath  Additional Objective Labs: Basic Metabolic Panel: Recent Labs  Lab 12/20/19 1258 12/20/19 1258 12/20/19 1645 12/20/19 1645 12/20/19 2115 12/21/19 0347 12/21/19 0741  NA 136  --  137  --   --  136  --   K 6.5*   < > 6.2*   < > 5.4* 4.6 4.5  CL 104  --  103  --   --  101  --   CO2 22  --  25  --   --  27  --   GLUCOSE 179*  --  204*  --   --  129*  --   BUN 62*  --  60*  --   --  47*  --   CREATININE 3.77*  --  3.83*  --   --  3.09*  --   CALCIUM 7.4*  --  7.4*  --   --  7.4*  --   PHOS  --   --   --   --   --  5.0*  --    < > = values in this interval not displayed.   Liver Function Tests: Recent Labs  Lab 12/19/19 2125 12/19/19 2125 12/20/19 0400 12/20/19 1258 12/21/19 0347  AST 179*  --  227* 122*  --   ALT 211*  --  284* 211*  --   ALKPHOS 171*  --  173* 133*  --   BILITOT 1.0  --  0.8 0.6  --   PROT 6.9  --  7.4 6.3*  --   ALBUMIN 3.2*   < > 3.5 2.9* 2.9*   < > = values in this interval not displayed.   No results for input(s): LIPASE, AMYLASE in the last 168 hours. CBC: Recent Labs  Lab 12/19/19 2125 12/20/19 0823 12/21/19 0347  WBC 17.6*  --  9.9  NEUTROABS 15.7*  --   --   HGB 10.7* 11.6* 9.7*  HCT 35.7*  34.0* 31.6*  MCV 98.9  --  99.7  PLT 292  --  245   Blood Culture No results found for: SDES, SPECREQUEST, CULT, REPTSTATUS  Cardiac Enzymes: Recent Labs  Lab 12/20/19 1645  CKTOTAL 2,234*   CBG: Recent Labs  Lab 12/20/19 1532 12/20/19 1931 12/20/19 2337 12/21/19 0322 12/21/19 0727  GLUCAP 207* 250* 166* 123* 119*   Iron Studies: No results for input(s): IRON, TIBC, TRANSFERRIN, FERRITIN in the last 72 hours. @lablastinr3 @ Studies/Results: CT Head Wo Contrast  Result Date: 12/20/2019 CLINICAL DATA:  Altered mental status EXAM: CT HEAD WITHOUT CONTRAST TECHNIQUE: Contiguous axial images were obtained from the base of  the skull through the vertex without intravenous contrast. COMPARISON:  None. FINDINGS: Brain: No evidence of acute territorial infarction, hemorrhage, hydrocephalus,extra-axial collection or mass lesion/mass effect. The ventricles are normal in size and contour. Low-attenuation changes in the deep white matter consistent with small vessel ischemia. Vascular: No hyperdense vessel or unexpected calcification. Skull: The skull is intact. No fracture or focal lesion identified. Sinuses/Orbits: The visualized paranasal sinuses and mastoid air cells are clear. The orbits and globes intact. Other: None IMPRESSION: No acute intracranial abnormality. Findings consistent with chronic small vessel ischemia Electronically Signed   By: Prudencio Pair M.D.   On: 12/20/2019 00:36   US RENAL  Result Date: 12/20/2019 CLINICAL DATA:  Acute kidney injury EXAM: RENAL / URINARY TRACT ULTRASOUND COMPLETE COMPARISON:  None. FINDINGS: Right Kidney: Renal measurements: 11.0 x 6.9 x 5.4 cm = volume: 211 mL . Echogenicity within normal limits. No mass or hydronephrosis visualized. Left Kidney: Renal measurements: 11.2 x 6.3 x 6.2 cm = volume: 227 mL. Echogenicity within normal limits. No mass or hydronephrosis visualized. Bladder: Decompressed Other: None. IMPRESSION: Normal appearing kidneys.   Decompressed bladder Electronically Signed   By: Prudencio Pair M.D.   On: 12/20/2019 04:37   DG CHEST PORT 1 VIEW  Result Date: 12/20/2019 CLINICAL DATA:  49 year old female with central line placement. EXAM: PORTABLE CHEST 1 VIEW COMPARISON:  Chest radiograph dated 12/19/2019. FINDINGS: Right IJ central venous line over central SVC. No pneumothorax. There is cardiomegaly with vascular congestion and probable mild edema, progressed since the prior radiograph. Pneumonia is not excluded. Clinical correlation is recommended. No large pleural effusion. No acute osseous pathology. IMPRESSION: 1. Interval placement of a right IJ central venous line with tip over central SVC. No pneumothorax. 2. Cardiomegaly with findings of CHF. Electronically Signed   By: Anner Crete M.D.   On: 12/20/2019 19:20   DG Chest Portable 1 View  Result Date: 12/19/2019 CLINICAL DATA:  Altered mental status EXAM: PORTABLE CHEST 1 VIEW COMPARISON:  None. FINDINGS: Cardiomegaly, vascular congestion. Left perihilar airspace opacity. No visible effusions. No acute bony abnormality. IMPRESSION: Cardiomegaly with vascular congestion. Left perihilar airspace disease could reflect asymmetric edema or pneumonia. Electronically Signed   By: Rolm Baptise M.D.   On: 12/19/2019 21:57   DG Foot 2 Views Right  Result Date: 12/20/2019 CLINICAL DATA:  Chronic right foot wound EXAM: RIGHT FOOT - 2 VIEW COMPARISON:  None. FINDINGS: There is no evidence of fracture or dislocation. Soft tissue swelling and ulceration overlying the medial aspect of the foot at the level of the great toe proximal phalanx and first MTP joint. There is subtle cortical irregularity along the medial cortex of the great toe proximal phalanx. No well-defined erosive change. Mild hallux valgus. Mild diffuse soft tissue swelling. Prominent vascular calcifications, advanced for age. IMPRESSION: Soft tissue swelling and ulceration overlying the medial aspect of the great toe.  There is subtle cortical irregularity along the medial cortex of the great toe proximal phalanx without well-defined erosive change. Findings may reflect sequela of acute or chronic infection. If clinically indicated, MRI could be performed for further evaluation. Electronically Signed   By: Davina Poke D.O.   On: 12/20/2019 11:43   ECHOCARDIOGRAM COMPLETE  Result Date: 12/20/2019    ECHOCARDIOGRAM REPORT   Patient Name:   Emma Patton Date of Exam: 12/20/2019 Medical Rec #:  [id number]            Height:       74.0 in Accession #:  [id number]           Weight:       320.5 lb Date of Birth:  13-Sep-1971            BSA:          2.656 m Patient Age:    20 years             BP:           105/57 mmHg Patient Gender: F                    HR:           79 bpm. Exam Location:  Inpatient Procedure: 2D Echo and Intracardiac Opacification Agent Indications:    Acute Respiratory Insufficiency 518.82 / R06.89  History:        Patient has no prior history of Echocardiogram examinations.                 Arrythmias:Atrial Fibrillation; Risk Factors:Diabetes and                 Hypertension. Acute kidney injury.  Sonographer:    Vikki Ports Turrentine Referring Phys: [id number] Silvestre Moment [facility]  Sonographer Comments: Suboptimal subcostal window and Technically difficult study due to poor echo windows. Image acquisition challenging due to patient body habitus. IMPRESSIONS  1. Left ventricular ejection fraction, by estimation, is 55 to 60%. The left ventricle has normal function. The left ventricle has no regional wall motion abnormalities. Left ventricular diastolic parameters were normal.  2. Right ventricular systolic function is normal. The right ventricular size is normal. Tricuspid regurgitation signal is inadequate for assessing PA pressure.  3. The mitral valve is grossly normal. No evidence of mitral valve regurgitation. No evidence of mitral stenosis.  4. The aortic valve is tricuspid. Aortic valve regurgitation is not  visualized. No aortic stenosis is present. FINDINGS  Left Ventricle: Left ventricular ejection fraction, by estimation, is 55 to 60%. The left ventricle has normal function. The left ventricle has no regional wall motion abnormalities. Definity contrast agent was given IV to delineate the left ventricular  endocardial borders. The left ventricular internal cavity size was normal in size. There is no left ventricular hypertrophy. Left ventricular diastolic parameters were normal. Normal left ventricular filling pressure. Right Ventricle: The right ventricular size is normal. No increase in right ventricular wall thickness. Right ventricular systolic function is normal. Tricuspid regurgitation signal is inadequate for assessing PA pressure. Left Atrium: Left atrial size was normal in size. Right Atrium: Right atrial size was normal in size. Pericardium: Trivial pericardial effusion is present. The pericardial effusion is posterior to the left ventricle. Presence of pericardial fat pad. Mitral Valve: The mitral valve is grossly normal. Mild mitral annular calcification. No evidence of mitral valve regurgitation. No evidence of mitral valve stenosis. Tricuspid Valve: The tricuspid valve is grossly normal. Tricuspid valve regurgitation is trivial. No evidence of tricuspid stenosis. Aortic Valve: The aortic valve is tricuspid. Aortic valve regurgitation is not visualized. No aortic stenosis is present. Pulmonic Valve: The pulmonic valve was grossly normal. Pulmonic valve regurgitation is not visualized. No evidence of pulmonic stenosis. Aorta: The aortic root is normal in size and structure. Venous: IVC assessment for right atrial pressure unable to be performed due to mechanical ventilation. IAS/Shunts: No atrial level shunt detected by color flow Doppler.  LEFT VENTRICLE PLAX 2D LVIDd:         5.80 cm  Diastology LVIDs:  3.80 cm  LV e' lateral:   10.90 cm/s LV PW:         1.00 cm  LV E/e' lateral: 10.4 LV IVS:         1.00 cm  LV e' medial:    5.93 cm/s LVOT diam:     2.40 cm  LV E/e' medial:  19.1 LV SV:         105 LV SV Index:   40 LVOT Area:     4.52 cm  RIGHT VENTRICLE RV S prime:     15.40 cm/s TAPSE (M-mode): 2.5 cm LEFT ATRIUM             Index       RIGHT ATRIUM           Index LA diam:        4.80 cm 1.81 cm/m  RA Area:     18.20 cm LA Vol (A2C):   81.1 ml 30.54 ml/m RA Volume:   47.10 ml  17.73 ml/m LA Vol (A4C):   74.3 ml 27.97 ml/m LA Biplane Vol: 80.5 ml 30.31 ml/m  AORTIC VALVE LVOT Vmax:   112.00 cm/s LVOT Vmean:  86.700 cm/s LVOT VTI:    0.233 m  AORTA Ao Root diam: 3.10 cm MITRAL VALVE MV Area (PHT): 2.99 cm     SHUNTS MV Decel Time: 254 msec     Systemic VTI:  0.23 m MV E velocity: 113.00 cm/s  Systemic Diam: 2.40 cm MV A velocity: 89.70 cm/s MV E/A ratio:  1.26 Eleonore Chiquito MD Electronically signed by Eleonore Chiquito MD Signature Date/Time: 12/20/2019/12:09:51 PM    Final    VAS [place] LOWER EXTREMITY VENOUS (DVT)  Result Date: 12/20/2019  Lower Venous DVTStudy Indications: Edema.  Limitations: Body habitus and poor ultrasound/tissue interface. Comparison Study: No prior exam. Performing Technologist: Baldwin Crown ARDMS, RVT  Examination Guidelines: A complete evaluation includes B-mode imaging, spectral Doppler, color Doppler, and power Doppler as needed of all accessible portions of each vessel. Bilateral testing is considered an integral part of a complete examination. Limited examinations for reoccurring indications may be performed as noted. The reflux portion of the exam is performed with the patient in reverse Trendelenburg.  +---------+---------------+---------+-----------+----------+------------------+ RIGHT    CompressibilityPhasicitySpontaneityPropertiesThrombus Aging     +---------+---------------+---------+-----------+----------+------------------+ CFV      Full           Yes      Yes                                      +---------+---------------+---------+-----------+----------+------------------+ SFJ      Full                                                            +---------+---------------+---------+-----------+----------+------------------+ FV Prox  Full                                                            +---------+---------------+---------+-----------+----------+------------------+ FV Mid   Full  visualized with                                                          color              +---------+---------------+---------+-----------+----------+------------------+ FV DistalFull                                         visualized with                                                          color              +---------+---------------+---------+-----------+----------+------------------+ PFV      Full                                                            +---------+---------------+---------+-----------+----------+------------------+ POP      Full           Yes      Yes                                     +---------+---------------+---------+-----------+----------+------------------+ PTV      Full                                         visualized with                                                          color              +---------+---------------+---------+-----------+----------+------------------+ PERO     Full                                         visualized with                                                          color              +---------+---------------+---------+-----------+----------+------------------+   +---------+---------------+---------+-----------+----------+------------------+ LEFT     CompressibilityPhasicitySpontaneityPropertiesThrombus Aging     +---------+---------------+---------+-----------+----------+------------------+ CFV      Full            Yes      Yes                                     +---------+---------------+---------+-----------+----------+------------------+  SFJ      Full                                                            +---------+---------------+---------+-----------+----------+------------------+ FV Prox  Full                                                            +---------+---------------+---------+-----------+----------+------------------+ FV Mid   Full                                                            +---------+---------------+---------+-----------+----------+------------------+ FV DistalFull                                         visualized with                                                          color              +---------+---------------+---------+-----------+----------+------------------+ PFV      Full                                                            +---------+---------------+---------+-----------+----------+------------------+ POP      Full           Yes      Yes                                     +---------+---------------+---------+-----------+----------+------------------+ PTV      Full                                         visualized with                                                          color              +---------+---------------+---------+-----------+----------+------------------+ PERO     Full  visualized with                                                          color              +---------+---------------+---------+-----------+----------+------------------+    Summary: RIGHT: - There is no evidence of deep vein thrombosis in the lower extremity. However, portions of this examination were limited- see technologist comments above.  - No cystic structure found in the popliteal fossa.  LEFT: - There is no evidence of deep vein thrombosis in the lower  extremity. However, portions of this examination were limited- see technologist comments above.  - No cystic structure found in the popliteal fossa.  *See table(s) above for measurements and observations. Electronically signed by Deitra Mayo MD on 12/20/2019 at 1:06:02 PM.    Final    Medications: .  prismasol BGK 4/2.5    . prismasol BGK 0/2.5 200 mL/hr at 12/20/19 2011  . prismasol BGK 0/2.5 1,500 mL/hr at 12/21/19 0745  . vancomycin HCl     . chlorhexidine  15 mL Mouth Rinse BID  . Chlorhexidine Gluconate Cloth  6 each Topical Q0600  . heparin  5,000 Units Subcutaneous Q8H  . hydrocerin   Topical BID  . insulin aspart  0-20 Units Subcutaneous Q4H  . mouth rinse  15 mL Mouth Rinse q12n4p  . mupirocin ointment  1 application Nasal BID  . pantoprazole (PROTONIX) IV  40 mg Intravenous QHS  . sodium zirconium cyclosilicate  10 g Oral TID  . vancomycin variable dose per unstable renal function (pharmacist dosing)   Does not apply See admin instructions    Assessment/Plan **AKI on CKD: per report has CKD but last known Cr 1.5 in 2018. Here with presumed AKI.  Suspect multifactorial with NSAIDs, ARB, hypotension with LOC episode, mild contribution of rhabdo (CK 2200).  UA and renal US ok.  Had 1 day of emesis but has had edema and orthopnea lately.  We ended up starting CRRT last PM for hyperkalemia and poor response to IV diuretic in setting of pulm edema.  No issues with filter clotting.  Continue for now.   **Hyperkalemia, severe: resolved with CRRT.    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  Follow vanc levels closely with AKI.   **Hypoxic resp failure: slowly improving, continue UF with CRRT.    **HTN:  On outpt meds which are held, currently hypotensive.   **DM: per primary.  Poorly controlled at baseline.   Jannifer Hick MD 12/21/2019, 9:16 AM  [facility] Pager: [phone]

## 2019-12-21 NOTE — Progress Notes (Signed)
CRRT stopped at 1340 due to clotting, 170ml blood able to be returned.  Spoke w/ Nephro MD to relay. Per Nephro, do not restart CRRT. Pt disconnected.

## 2019-12-21 NOTE — Progress Notes (Signed)
eLink Physician-Brief Progress Note Patient Name: Emma Patton DOB: 10/22/1970 MRN: [id number]   Date of Service  12/21/2019  HPI/Events of Note  Pt wants something for sleep, and for constipation.  eICU Interventions  Melatonin 3 mg po hs prn ordered, Miralax tabs ordered prn.        Frederik Pear 12/21/2019, 9:29 PM

## 2019-12-21 NOTE — Progress Notes (Signed)
Pharmacy Antibiotic Note  Emma Patton is a 49 y.o. female admitted on 12/19/2019 with AMS.  She was found down by her husband after taking Percocet and responded to Narcan in the field.  Pharmacy has been consulted for vancomycin dosing for foot osteomyelitis.  Patient started on CRRT on 3/23 PM.  Vancomycin random level is 23 mcg/mL, indicating an adequate load.  Afebrile, WBC normalized.  Plan: Schedule vanc 1500mg  IV Q24H Monitor CRRT tolerance/interruptions, clinical progress, vanc level as indicated  Height: 6\' 2"  (188 cm) Weight: (!) 328 lb 0.7 oz (148.8 kg) IBW/kg (Calculated) : 77.7  Temp (24hrs), Avg:97.9 F (36.6 C), Min:97.3 F (36.3 C), Max:98.5 F (36.9 C)  Recent Labs  Lab 12/19/19 2125 12/19/19 2125 12/20/19 0528 12/20/19 0715 12/20/19 1258 12/20/19 1645 12/21/19 0347  WBC 17.6*  --   --   --   --   --  9.9  CREATININE 3.52*   < > 3.70* 3.76* 3.77* 3.83* 3.09*  VANCORANDOM  --   --   --   --   --   --  23   < > = values in this interval not displayed.    Estimated Creatinine Clearance: 36.5 mL/min (A) (by C-G formula based on SCr of 3.09 mg/dL (H)).    Allergies  Allergen Reactions  . Penicillins Rash and Hives   Vanc 3/23 >>  3/23 vanc 2500mg  around 1300 3/24 VR = 23 mcg/mL  3/22 covid / flu - negative 3/23 MRSA PCR - positive  Emmitte Surgeon D. Mina Marble, PharmD, BCPS, [place] 12/21/2019, 7:43 AM

## 2019-12-21 NOTE — Progress Notes (Addendum)
NAME:  Emma Patton, MRN:  [id number], DOB:  Jan 07, 1971, LOS: 1 ADMISSION DATE:  12/19/2019, CONSULTATION DATE:  3/23 REFERRING MD: Dr. Roxanne Mins EDP, CHIEF COMPLAINT: Altered mental status  Brief History   49 year old female with multiple medical problems found down by her husband at home after reportedly taking Percocet.  Did respond to Narcan in the field.  Was hypoxic in the ER requiring supplemental O2.  Hyperkalemic to 7.5.  History of present illness   49 year old female with past medical history as below which is significant for chronic kidney disease and hypertension.  She also takes chronic opioids for back pain with a history of osteomyelitis of the spine.  In the late evening hours of 3/22 she was unable to be aroused by her husband.  It is reported that she took opioid pain medications earlier in the night.  EMS was called and administered Narcan to which she had a positive response.    She was transported to the emergency department at [facility].  She was unable to give a good history at that time.  Upon arrival to the emergency department she was noted to be hypoxemic requiring supplemental oxygen.  Her oxygen saturation dropped as low as into the 60s at one point.  Laboratory evaluation was significant for creatinine of 3.5, potassium of greater than 7.5, high-sensitivity troponin of 68, AST 179, ALT 211, WBC 17.6, and hemoglobin 10.7.  CT of the head was unremarkable.  Initial COVID-19 antigen testing was negative.  Her mental status did improve after Narcan administration and with supplemental oxygen.  Her hyperkalemia was treated with temporizing measures as well as a one-time dose of Lokelma.  Given hyperkalemia and hypoxemia she was felt to be a candidate for admission to ICU and PCCM was consulted.  She is a bit more awake now, but still rather unclear of the events preceding her presentation tonight.  For example, she tells that she takes Percocet 3 times a day but  also tells that she has not taken it for several days.  History from her seems unreliable at this time.  Past Medical History  CKD, DM, GERD, Lyme disease, Bacteremia, Osteomyelitis of the spine, HTN.   Significant Hospital Events   3/22 admitted  Consults:    Procedures:    Significant Diagnostic Tests:  CT head 3/23> no acute intracranial abnormality.  Chronic small vessel ischemia Renal ultrasound 3/23 >> normal kidneys  Micro Data:  SARSCoV2 3/22 >> negative Flu A&B 3/22 >> negative MRSA screen 3/23 >> positive   Antimicrobials:  vanco 3/23 >>   Interim history/subjective:  CVVHD was started 3/23 Currently on 12L/min HFNC vanco started 3/23 for suspected osteo R foot.   Objective   Blood pressure 111/62, pulse 70, temperature (!) 97.3 F (36.3 C), temperature source Oral, resp. rate 13, height 6\' 2"  (1.88 m), weight (!) 148.8 kg, SpO2 94 %.        Intake/Output Summary (Last 24 hours) at 12/21/2019 1056 Last data filed at 12/21/2019 1000 Gross per 24 hour  Intake 1252 ml  Output 2774 ml  Net -1522 ml   Filed Weights   12/19/19 2134 12/20/19 0500 12/21/19 0500  Weight: 79.4 kg (!) 145.4 kg (!) 148.8 kg    Examination: General: Obese woman, awake, no distress HENT: Oropharynx clear, no upper airway noise or secretions Lungs: Decreased at both bases, otherwise clear, no wheezing Cardiovascular: Regular, distant, no murmur, 2+ pedal edema Abdomen: Obese, nontender, nondistended, positive bowel sounds Extremities: She  has an ulcerated lesion on the medial aspect of the right foot at the great toe.  Granulation tissue present, no odor, no discharge.  Does not appear to track to bone Neuro: Alert, oriented, awake, appropriate, follows commands and answers all questions.  Moves all extremities.  Resolved Hospital Problem list     Assessment & Plan:   Acute hypoxemic respiratory failure: COVID testing negative. ? component pulmonary edema, consider aspiration  pneumonitis given encephalopathy on presentation.  Consider ALI Continue submental oxygen Could consider BiPAP if needed Volume removal with CVVHD as she can tolerate No clear evidence to support bacterial pneumonia, following clinically  Acute renal failure: Nonoliguric Superimposed on chronic kidney disease Appreciate nephrology assistance, CVVH initiated 3/23, tolerating Follow BMP, potassium particularly. Renal ultrasound reassuring Follow urine output.  Hopefully she will have a renal recovery  Hyperkalemia:  Medical temporizing measures discontinued Lasix discontinued Follow BMP on CVVHD  Suspected osteomyelitis right foot.  Noted on plain film imaging Empiric vancomycin added 3/23 Likely needs an MRI of the foot when stable to do so Orthopedics evaluation when stable to do so  Diabetes mellitus: Hemoglobin A1c 10.9.  Patient has an insulin pump at home Sitagliptin on hold On sliding scale insulin resistance scale  Hypertension: Home medication list includes amlodipine and lasix Restart amlodipine when blood pressure will allow Diuretics on hold pending evaluation for possible renal recovery  Chronic back pain:  History of bacteremia and osteomyelitis of spine Careful with sedating medications, question accidental narcotic overdose at presentation. Holding oxycodone, Lyrica, trazodone for now   Best practice:  Diet: carb modified renal diet,  Pain/Anxiety/Delirium protocol (if indicated): restart low dose oxycodone VAP protocol (if indicated): NA DVT prophylaxis: SQH GI prophylaxis: NA Glucose control: SSI Mobility: BR Code Status: FULL Family Communication: spoke with pt at bedside, mother by phone 3/24.  She notes that her mother should be her primary medical contact. Also has a husband. Contact info being entered into chart.  Disposition: ICU  Labs   CBC: Recent Labs  Lab 12/19/19 2125 12/20/19 0823 12/21/19 0347  WBC 17.6*  --  9.9  NEUTROABS 15.7*   --   --   HGB 10.7* 11.6* 9.7*  HCT 35.7* 34.0* 31.6*  MCV 98.9  --  99.7  PLT 292  --  696    Basic Metabolic Panel: Recent Labs  Lab 12/20/19 0528 12/20/19 0528 12/20/19 0715 12/20/19 0715 12/20/19 0823 12/20/19 0823 12/20/19 1258 12/20/19 1645 12/20/19 2115 12/21/19 0347 12/21/19 0741  NA 135   < > 136  --  134*  --  136 137  --  136  --   K 6.6*   < > 7.3*   < > 6.8*   < > 6.5* 6.2* 5.4* 4.6 4.5  CL 100  --  102  --   --   --  104 103  --  101  --   CO2 23  --  22  --   --   --  22 25  --  27  --   GLUCOSE 261*  --  259*  --   --   --  179* 204*  --  129*  --   BUN 59*  --  61*  --   --   --  62* 60*  --  47*  --   CREATININE 3.70*  --  3.76*  --   --   --  3.77* 3.83*  --  3.09*  --  CALCIUM 8.2*  --  7.9*  --   --   --  7.4* 7.4*  --  7.4*  --   MG 2.2  --   --   --   --   --   --   --   --  2.2  --   PHOS  --   --   --   --   --   --   --   --   --  5.0*  --    < > = values in this interval not displayed.   GFR: Estimated Creatinine Clearance: 36.5 mL/min (A) (by C-G formula based on SCr of 3.09 mg/dL (H)). Recent Labs  Lab 12/19/19 2125 12/20/19 0528 12/21/19 0347  PROCALCITON  --  1.32 1.17  WBC 17.6*  --  9.9    Liver Function Tests: Recent Labs  Lab 12/19/19 2125 12/20/19 0400 12/20/19 1258 12/21/19 0347  AST 179* 227* 122*  --   ALT 211* 284* 211*  --   ALKPHOS 171* 173* 133*  --   BILITOT 1.0 0.8 0.6  --   PROT 6.9 7.4 6.3*  --   ALBUMIN 3.2* 3.5 2.9* 2.9*   No results for input(s): LIPASE, AMYLASE in the last 168 hours. No results for input(s): AMMONIA in the last 168 hours.  ABG    Component Value Date/Time   PHART 7.216 (L) 12/20/2019 0823   PCO2ART 64.4 (H) 12/20/2019 0823   PO2ART 77.0 (L) 12/20/2019 0823   HCO3 26.3 12/20/2019 0823   TCO2 28 12/20/2019 0823   ACIDBASEDEF 3.0 (H) 12/20/2019 0823   O2SAT 92.0 12/20/2019 0823     Coagulation Profile: Recent Labs  Lab 12/20/19 0528  INR 1.2    Cardiac Enzymes: Recent  Labs  Lab 12/20/19 1645  CKTOTAL 2,234*    HbA1C: Hgb A1c MFr Bld  Date/Time Value Ref Range Status  12/20/2019 05:28 AM 10.9 (H) 4.8 - 5.6 % Final    Comment:    (NOTE) Pre diabetes:          5.7%-6.4% Diabetes:              >6.4% Glycemic control for   <7.0% adults with diabetes     CBG: Recent Labs  Lab 12/20/19 1532 12/20/19 1931 12/20/19 2337 12/21/19 0322 12/21/19 0727  GLUCAP 207* 250* 166* 123* 119*     Critical care time: 33 minutes     Baltazar Apo, MD, PhD 12/21/2019, 11:10 AM [facility] [phone] or if no answer [phone]

## 2019-12-22 LAB — GLUCOSE, CAPILLARY
Glucose-Capillary: 195 mg/dL — ABNORMAL HIGH (ref 70–99)
Glucose-Capillary: 165 mg/dL — ABNORMAL HIGH (ref 70–99)
Glucose-Capillary: 168 mg/dL — ABNORMAL HIGH (ref 70–99)
Glucose-Capillary: 218 mg/dL — ABNORMAL HIGH (ref 70–99)
Glucose-Capillary: 275 mg/dL — ABNORMAL HIGH (ref 70–99)

## 2019-12-22 LAB — RENAL FUNCTION PANEL
Albumin: 2.8 g/dL — ABNORMAL LOW (ref 3.5–5.0)
Anion gap: 9 (ref 5–15)
BUN: 44 mg/dL — ABNORMAL HIGH (ref 6–20)
CO2: 26 mmol/L (ref 22–32)
Calcium: 7.4 mg/dL — ABNORMAL LOW (ref 8.9–10.3)
Chloride: 100 mmol/L (ref 98–111)
Creatinine, Ser: 2.09 mg/dL — ABNORMAL HIGH (ref 0.44–1.00)
GFR calc Af Amer: 31 mL/min — ABNORMAL LOW (ref >60)
GFR calc non Af Amer: 27 mL/min — ABNORMAL LOW (ref >60)
Glucose, Bld: 193 mg/dL — ABNORMAL HIGH (ref 70–99)
Phosphorus: 2.9 mg/dL (ref 2.5–4.6)
Potassium: 3.8 mmol/L (ref 3.5–5.1)
Sodium: 135 mmol/L (ref 135–145)

## 2019-12-22 LAB — CBC
HCT: 29.5 % — ABNORMAL LOW (ref 36.0–46.0)
Hemoglobin: 9.3 g/dL — ABNORMAL LOW (ref 12.0–15.0)
MCH: 30.3 pg (ref 26.0–34.0)
MCHC: 31.5 g/dL (ref 30.0–36.0)
MCV: 96.1 fL (ref 80.0–100.0)
Platelets: 229 10*3/uL (ref 150–400)
RBC: 3.07 MIL/uL — ABNORMAL LOW (ref 3.87–5.11)
RDW: 14.7 % (ref 11.5–15.5)
WBC: 8.6 10*3/uL (ref 4.0–10.5)
nRBC: 0.3 % — ABNORMAL HIGH (ref 0.0–0.2)

## 2019-12-22 LAB — MAGNESIUM: Magnesium: 2 mg/dL (ref 1.7–2.4)

## 2019-12-22 LAB — UREA NITROGEN, URINE: Urea Nitrogen, Ur: 244 mg/dL

## 2019-12-22 MED ORDER — CHOLECALCIFEROL 10 MCG (400 UNIT) PO TABS
400.0000 [IU] | ORAL_TABLET | ORAL | Status: DC
  Administered 2019-12-22 – 2019-12-26 (×2): 400 [IU] via ORAL
  Filled 2019-12-22 (×2): qty 1

## 2019-12-22 MED ORDER — OXYCODONE-ACETAMINOPHEN 5-325 MG PO TABS
1.0000 | ORAL_TABLET | Freq: Three times a day (TID) | ORAL | Status: DC | PRN
  Administered 2019-12-23 – 2019-12-26 (×4): 1 via ORAL
  Filled 2019-12-22 (×4): qty 1

## 2019-12-22 MED ORDER — LINACLOTIDE 145 MCG PO CAPS
145.0000 ug | ORAL_CAPSULE | Freq: Every day | ORAL | Status: DC
  Filled 2019-12-22 (×5): qty 1

## 2019-12-22 MED ORDER — LORATADINE 10 MG PO TABS
10.0000 mg | ORAL_TABLET | Freq: Every day | ORAL | Status: DC
  Administered 2019-12-22 – 2019-12-26 (×5): 10 mg via ORAL
  Filled 2019-12-22 (×5): qty 1

## 2019-12-22 MED ORDER — PREGABALIN 75 MG PO CAPS
75.0000 mg | ORAL_CAPSULE | Freq: Three times a day (TID) | ORAL | Status: DC
  Administered 2019-12-22 – 2019-12-26 (×12): 75 mg via ORAL
  Filled 2019-12-22 (×12): qty 1

## 2019-12-22 MED ORDER — DOXYCYCLINE HYCLATE 100 MG PO TABS
100.0000 mg | ORAL_TABLET | Freq: Two times a day (BID) | ORAL | Status: DC
  Administered 2019-12-22 – 2019-12-26 (×8): 100 mg via ORAL
  Filled 2019-12-22 (×9): qty 1

## 2019-12-22 MED ORDER — OXYCODONE HCL 5 MG PO TABS
5.0000 mg | ORAL_TABLET | Freq: Three times a day (TID) | ORAL | Status: DC | PRN
  Administered 2019-12-23 – 2019-12-26 (×4): 5 mg via ORAL
  Filled 2019-12-22 (×4): qty 1

## 2019-12-22 MED ORDER — VITAMIN D2 10 MCG (400 UNIT) PO TABS: 1.2500 mg | ORAL_TABLET | ORAL | Status: DC

## 2019-12-22 MED ORDER — FERROUS SULFATE 325 (65 FE) MG PO TABS
325.0000 mg | ORAL_TABLET | Freq: Every day | ORAL | Status: DC
  Administered 2019-12-23 – 2019-12-26 (×4): 325 mg via ORAL
  Filled 2019-12-22 (×5): qty 1

## 2019-12-22 MED ORDER — ZOLPIDEM TARTRATE 5 MG PO TABS
5.0000 mg | ORAL_TABLET | Freq: Every evening | ORAL | Status: DC | PRN
  Administered 2019-12-22 – 2019-12-25 (×4): 5 mg via ORAL
  Filled 2019-12-22 (×4): qty 1

## 2019-12-22 MED ORDER — DOXYCYCLINE HYCLATE 100 MG PO TABS
100.0000 mg | ORAL_TABLET | Freq: Two times a day (BID) | ORAL | Status: DC
  Filled 2019-12-22 (×2): qty 1

## 2019-12-22 MED ORDER — OXYCODONE-ACETAMINOPHEN 10-325 MG PO TABS: 1.0000 | ORAL_TABLET | Freq: Three times a day (TID) | ORAL | Status: DC | PRN

## 2019-12-22 MED ORDER — AMLODIPINE BESYLATE 5 MG PO TABS
5.0000 mg | ORAL_TABLET | Freq: Every day | ORAL | Status: DC
  Administered 2019-12-22 – 2019-12-26 (×5): 5 mg via ORAL
  Filled 2019-12-22 (×5): qty 1

## 2019-12-22 MED ORDER — VANCOMYCIN VARIABLE DOSE PER UNSTABLE RENAL FUNCTION (PHARMACIST DOSING): Status: DC

## 2019-12-22 MED ORDER — ATORVASTATIN CALCIUM 10 MG PO TABS
10.0000 mg | ORAL_TABLET | Freq: Every day | ORAL | Status: DC
  Administered 2019-12-22 – 2019-12-26 (×5): 10 mg via ORAL
  Filled 2019-12-22 (×5): qty 1

## 2019-12-22 MED ORDER — MAGNESIUM OXIDE 400 (241.3 MG) MG PO TABS
400.0000 mg | ORAL_TABLET | Freq: Every day | ORAL | Status: DC
  Administered 2019-12-22 – 2019-12-26 (×5): 400 mg via ORAL
  Filled 2019-12-22 (×6): qty 1

## 2019-12-22 MED ORDER — PANTOPRAZOLE SODIUM 40 MG PO TBEC
40.0000 mg | DELAYED_RELEASE_TABLET | Freq: Every day | ORAL | Status: DC
  Administered 2019-12-22 – 2019-12-25 (×4): 40 mg via ORAL
  Filled 2019-12-22 (×4): qty 1

## 2019-12-22 MED ORDER — MAGNESIUM OXIDE 400 MG PO TABS: 400.0000 mg | ORAL_TABLET | Freq: Every day | ORAL | Status: DC

## 2019-12-22 NOTE — Progress Notes (Signed)
[facility] Progress Note  Emma Patton is an 49 y.o. female with DM type 2, HTN, HL, GERD, CKD, chronic pain who is seen for evaluation and management of AKI on CKD in setting of suspected unintentional opioid overdose + ARB + NSAIDs, also with foot osteomyelitis. Required CRRT initiation 3/23 for persistent hyperK.   Subjective:  Off CRRT since ~1pm yesterday.  Feeling ok this AM.  I/Os 1.8 / 3.7 - UOP 2.2, UF 1.5.   Objective Vitals:   12/22/19 0500 12/22/19 0600 12/22/19 0738 12/22/19 0822  BP: (!) 120/57 127/61  (!) 158/70  Pulse: 65 62    Resp: 15 16  18   Temp:   98.1 F (36.7 C)   TempSrc:   Axillary   SpO2: 95% 95%    Weight: (!) 146.4 kg     Height:       Physical Exam General: obese, comfortable in bed Heart: RRR, no rub Lungs: clear ant,normal WOB on 4L HFNC Abdomen: soft, obese Extremities: trace edema, R foot with dressing.   Neuro: conversant and oriented, quicker to respond than yesterday  Dialysis Access: RIJ temp cath  Additional Objective Labs: Basic Metabolic Panel: Recent Labs  Lab 12/21/19 0347 12/21/19 0347 12/21/19 0741 12/21/19 1641 12/22/19 0352  NA 136  --   --  136 135  K 4.6   < > 4.5 4.4 3.8  CL 101  --   --  105 100  CO2 27  --   --  27 26  GLUCOSE 129*  --   --  180* 193*  BUN 47*  --   --  40* 44*  CREATININE 3.09*  --   --  2.27* 2.09*  CALCIUM 7.4*  --   --  7.0* 7.4*  PHOS 5.0*  --   --  3.8 2.9   < > = values in this interval not displayed.   Liver Function Tests: Recent Labs  Lab 12/19/19 2125 12/19/19 2125 12/20/19 0400 12/20/19 0400 12/20/19 1258 12/20/19 1258 12/21/19 0347 12/21/19 1641 12/22/19 0352  AST 179*  --  227*  --  122*  --   --   --   --   ALT 211*  --  284*  --  211*  --   --   --   --   ALKPHOS 171*  --  173*  --  133*  --   --   --   --   BILITOT 1.0  --  0.8  --  0.6  --   --   --   --   PROT 6.9  --  7.4  --  6.3*  --   --   --   --   ALBUMIN 3.2*   < > 3.5   < > 2.9*    < > 2.9* 2.7* 2.8*   < > = values in this interval not displayed.   No results for input(s): LIPASE, AMYLASE in the last 168 hours. CBC: Recent Labs  Lab 12/19/19 2125 12/19/19 2125 12/20/19 0823 12/21/19 0347 12/22/19 0352  WBC 17.6*  --   --  9.9 8.6  NEUTROABS 15.7*  --   --   --   --   HGB 10.7*   < > 11.6* 9.7* 9.3*  HCT 35.7*   < > 34.0* 31.6* 29.5*  MCV 98.9  --   --  99.7 96.1  PLT 292  --   --  245 229   < > =  values in this interval not displayed.   Blood Culture No results found for: SDES, SPECREQUEST, CULT, REPTSTATUS  Cardiac Enzymes: Recent Labs  Lab 12/20/19 1645  CKTOTAL 2,234*   CBG: Recent Labs  Lab 12/21/19 1525 12/21/19 1921 12/21/19 2315 12/22/19 0322 12/22/19 0735  GLUCAP 169* 191* 217* 195* 165*   Iron Studies: No results for input(s): IRON, TIBC, TRANSFERRIN, FERRITIN in the last 72 hours. @lablastinr3 @ Studies/Results: DG CHEST PORT 1 VIEW  Result Date: 12/20/2019 CLINICAL DATA:  49 year old female with central line placement. EXAM: PORTABLE CHEST 1 VIEW COMPARISON:  Chest radiograph dated 12/19/2019. FINDINGS: Right IJ central venous line over central SVC. No pneumothorax. There is cardiomegaly with vascular congestion and probable mild edema, progressed since the prior radiograph. Pneumonia is not excluded. Clinical correlation is recommended. No large pleural effusion. No acute osseous pathology. IMPRESSION: 1. Interval placement of a right IJ central venous line with tip over central SVC. No pneumothorax. 2. Cardiomegaly with findings of CHF. Electronically Signed   By: Anner Crete M.D.   On: 12/20/2019 19:20   DG Foot 2 Views Right  Result Date: 12/20/2019 CLINICAL DATA:  Chronic right foot wound EXAM: RIGHT FOOT - 2 VIEW COMPARISON:  None. FINDINGS: There is no evidence of fracture or dislocation. Soft tissue swelling and ulceration overlying the medial aspect of the foot at the level of the great toe proximal phalanx and first MTP  joint. There is subtle cortical irregularity along the medial cortex of the great toe proximal phalanx. No well-defined erosive change. Mild hallux valgus. Mild diffuse soft tissue swelling. Prominent vascular calcifications, advanced for age. IMPRESSION: Soft tissue swelling and ulceration overlying the medial aspect of the great toe. There is subtle cortical irregularity along the medial cortex of the great toe proximal phalanx without well-defined erosive change. Findings may reflect sequela of acute or chronic infection. If clinically indicated, MRI could be performed for further evaluation. Electronically Signed   By: Davina Poke D.O.   On: 12/20/2019 11:43   ECHOCARDIOGRAM COMPLETE  Result Date: 12/20/2019    ECHOCARDIOGRAM REPORT   Patient Name:   Emma Patton Date of Exam: 12/20/2019 Medical Rec #:  [id number]            Height:       74.0 in Accession #:    [id number]           Weight:       320.5 lb Date of Birth:  Jun 11, 1971            BSA:          2.656 m Patient Age:    57 years             BP:           105/57 mmHg Patient Gender: F                    HR:           79 bpm. Exam Location:  Inpatient Procedure: 2D Echo and Intracardiac Opacification Agent Indications:    Acute Respiratory Insufficiency 518.82 / R06.89  History:        Patient has no prior history of Echocardiogram examinations.                 Arrythmias:Atrial Fibrillation; Risk Factors:Diabetes and                 Hypertension. Acute kidney injury.  Sonographer:    Vikki Ports Turrentine Referring  Phys: [id number] Silvestre Moment [facility]  Sonographer Comments: Suboptimal subcostal window and Technically difficult study due to poor echo windows. Image acquisition challenging due to patient body habitus. IMPRESSIONS  1. Left ventricular ejection fraction, by estimation, is 55 to 60%. The left ventricle has normal function. The left ventricle has no regional wall motion abnormalities. Left ventricular diastolic parameters were normal.  2.  Right ventricular systolic function is normal. The right ventricular size is normal. Tricuspid regurgitation signal is inadequate for assessing PA pressure.  3. The mitral valve is grossly normal. No evidence of mitral valve regurgitation. No evidence of mitral stenosis.  4. The aortic valve is tricuspid. Aortic valve regurgitation is not visualized. No aortic stenosis is present. FINDINGS  Left Ventricle: Left ventricular ejection fraction, by estimation, is 55 to 60%. The left ventricle has normal function. The left ventricle has no regional wall motion abnormalities. Definity contrast agent was given IV to delineate the left ventricular  endocardial borders. The left ventricular internal cavity size was normal in size. There is no left ventricular hypertrophy. Left ventricular diastolic parameters were normal. Normal left ventricular filling pressure. Right Ventricle: The right ventricular size is normal. No increase in right ventricular wall thickness. Right ventricular systolic function is normal. Tricuspid regurgitation signal is inadequate for assessing PA pressure. Left Atrium: Left atrial size was normal in size. Right Atrium: Right atrial size was normal in size. Pericardium: Trivial pericardial effusion is present. The pericardial effusion is posterior to the left ventricle. Presence of pericardial fat pad. Mitral Valve: The mitral valve is grossly normal. Mild mitral annular calcification. No evidence of mitral valve regurgitation. No evidence of mitral valve stenosis. Tricuspid Valve: The tricuspid valve is grossly normal. Tricuspid valve regurgitation is trivial. No evidence of tricuspid stenosis. Aortic Valve: The aortic valve is tricuspid. Aortic valve regurgitation is not visualized. No aortic stenosis is present. Pulmonic Valve: The pulmonic valve was grossly normal. Pulmonic valve regurgitation is not visualized. No evidence of pulmonic stenosis. Aorta: The aortic root is normal in size and  structure. Venous: IVC assessment for right atrial pressure unable to be performed due to mechanical ventilation. IAS/Shunts: No atrial level shunt detected by color flow Doppler.  LEFT VENTRICLE PLAX 2D LVIDd:         5.80 cm  Diastology LVIDs:         3.80 cm  LV e' lateral:   10.90 cm/s LV PW:         1.00 cm  LV E/e' lateral: 10.4 LV IVS:        1.00 cm  LV e' medial:    5.93 cm/s LVOT diam:     2.40 cm  LV E/e' medial:  19.1 LV SV:         105 LV SV Index:   40 LVOT Area:     4.52 cm  RIGHT VENTRICLE RV S prime:     15.40 cm/s TAPSE (M-mode): 2.5 cm LEFT ATRIUM             Index       RIGHT ATRIUM           Index LA diam:        4.80 cm 1.81 cm/m  RA Area:     18.20 cm LA Vol (A2C):   81.1 ml 30.54 ml/m RA Volume:   47.10 ml  17.73 ml/m LA Vol (A4C):   74.3 ml 27.97 ml/m LA Biplane Vol: 80.5 ml 30.31 ml/m  AORTIC VALVE LVOT Vmax:  112.00 cm/s LVOT Vmean:  86.700 cm/s LVOT VTI:    0.233 m  AORTA Ao Root diam: 3.10 cm MITRAL VALVE MV Area (PHT): 2.99 cm     SHUNTS MV Decel Time: 254 msec     Systemic VTI:  0.23 m MV E velocity: 113.00 cm/s  Systemic Diam: 2.40 cm MV A velocity: 89.70 cm/s MV E/A ratio:  1.26 Eleonore Chiquito MD Electronically signed by Eleonore Chiquito MD Signature Date/Time: 12/20/2019/12:09:51 PM    Final    VAS [place] LOWER EXTREMITY VENOUS (DVT)  Result Date: 12/20/2019  Lower Venous DVTStudy Indications: Edema.  Limitations: Body habitus and poor ultrasound/tissue interface. Comparison Study: No prior exam. Performing Technologist: Baldwin Crown ARDMS, RVT  Examination Guidelines: A complete evaluation includes B-mode imaging, spectral Doppler, color Doppler, and power Doppler as needed of all accessible portions of each vessel. Bilateral testing is considered an integral part of a complete examination. Limited examinations for reoccurring indications may be performed as noted. The reflux portion of the exam is performed with the patient in reverse Trendelenburg.   +---------+---------------+---------+-----------+----------+------------------+ RIGHT    CompressibilityPhasicitySpontaneityPropertiesThrombus Aging     +---------+---------------+---------+-----------+----------+------------------+ CFV      Full           Yes      Yes                                     +---------+---------------+---------+-----------+----------+------------------+ SFJ      Full                                                            +---------+---------------+---------+-----------+----------+------------------+ FV Prox  Full                                                            +---------+---------------+---------+-----------+----------+------------------+ FV Mid   Full                                         visualized with                                                          color              +---------+---------------+---------+-----------+----------+------------------+ FV DistalFull                                         visualized with  color              +---------+---------------+---------+-----------+----------+------------------+ PFV      Full                                                            +---------+---------------+---------+-----------+----------+------------------+ POP      Full           Yes      Yes                                     +---------+---------------+---------+-----------+----------+------------------+ PTV      Full                                         visualized with                                                          color              +---------+---------------+---------+-----------+----------+------------------+ PERO     Full                                         visualized with                                                          color               +---------+---------------+---------+-----------+----------+------------------+   +---------+---------------+---------+-----------+----------+------------------+ LEFT     CompressibilityPhasicitySpontaneityPropertiesThrombus Aging     +---------+---------------+---------+-----------+----------+------------------+ CFV      Full           Yes      Yes                                     +---------+---------------+---------+-----------+----------+------------------+ SFJ      Full                                                            +---------+---------------+---------+-----------+----------+------------------+ FV Prox  Full                                                            +---------+---------------+---------+-----------+----------+------------------+ FV Mid   Full                                                            +---------+---------------+---------+-----------+----------+------------------+  FV DistalFull                                         visualized with                                                          color              +---------+---------------+---------+-----------+----------+------------------+ PFV      Full                                                            +---------+---------------+---------+-----------+----------+------------------+ POP      Full           Yes      Yes                                     +---------+---------------+---------+-----------+----------+------------------+ PTV      Full                                         visualized with                                                          color              +---------+---------------+---------+-----------+----------+------------------+ PERO     Full                                         visualized with                                                          color               +---------+---------------+---------+-----------+----------+------------------+    Summary: RIGHT: - There is no evidence of deep vein thrombosis in the lower extremity. However, portions of this examination were limited- see technologist comments above.  - No cystic structure found in the popliteal fossa.  LEFT: - There is no evidence of deep vein thrombosis in the lower extremity. However, portions of this examination were limited- see technologist comments above.  - No cystic structure found in the popliteal fossa.  *See table(s) above for measurements and observations. Electronically signed by Deitra Mayo MD on 12/20/2019 at 1:06:02 PM.    Final    Medications: .  prismasol BGK 4/2.5 400 mL/hr at 12/21/19 9675  .  prismasol BGK 0/2.5 200 mL/hr at 12/20/19 2011  . prismasol BGK 0/2.5 1,500 mL/hr at 12/21/19 1058   . chlorhexidine  15 mL Mouth Rinse BID  . Chlorhexidine Gluconate Cloth  6 each Topical Q0600  . heparin  5,000 Units Subcutaneous Q8H  . hydrocerin   Topical BID  . insulin aspart  0-20 Units Subcutaneous Q4H  . mouth rinse  15 mL Mouth Rinse q12n4p  . melatonin  3 mg Oral QHS  . mupirocin ointment  1 application Nasal BID  . pantoprazole  40 mg Oral QHS  . polyethylene glycol  17 g Oral Daily  . vancomycin variable dose per unstable renal function (pharmacist dosing)   Does not apply See admin instructions    Assessment/Plan **AKI on CKD: per report has CKD but last known Cr 1.5 in 2018. Here with presumed AKI Cr 3.5 on presentation, peak 3.9 3/23.   Suspect multifactorial with NSAIDs, ARB, hypotension with LOC episode, mild contribution of rhabdo (CK 2200).  UA and renal US ok.  Had 1 day of emesis but has had edema and orthopnea lately.  Started CRRT 3/23 PM for hyperkalemia and poor response to IV diuretic in setting of pulm edema; required < 24h and issues improved and UOP back to normal.  Labs this AM suggest recovery of renal function.  Ok to remove HD catheter as  further RRT isn't indicated.  Can be referred to nephrology outpt per PCP if needed.    **Hyperkalemia, severe: resolved with CRRT.    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  Follow vanc levels closely with AKI.   **Hypoxic resp failure: slowly improving, weaned to 3L this AM  **HTN:  On outpt meds which are held, currently normotensive.   **DM: per primary.  Poorly controlled at baseline.   **Anemia: per primary.  I will sign off -- please page with question/concerns.   Jannifer Hick MD 12/22/2019, 9:00 AM  [facility] Pager: [phone]

## 2019-12-22 NOTE — Progress Notes (Addendum)
Report called to 3E for transport to room 19; report given to [facility] @ [phone].   Per provider, ok to leave foley til morning to ensure accurate output since pt was on CRRT.  Notified husband of the move.  Pt belongings(clothing & purple bag containing home meds) packed for transport with patient.

## 2019-12-22 NOTE — Progress Notes (Signed)
NAME:  Emma Patton, MRN:  [id number], DOB:  Feb 18, 1971, LOS: 2 ADMISSION DATE:  12/19/2019, CONSULTATION DATE:  3/23 REFERRING MD: Dr. Roxanne Mins EDP, CHIEF COMPLAINT: Altered mental status  Brief History   49 year old female with multiple medical problems found down by her husband at home after reportedly taking Percocet.  Did respond to Narcan in the field.  Was hypoxic in the ER requiring supplemental O2.  Hyperkalemic to 7.5.  History of present illness   49 year old female with past medical history as below which is significant for chronic kidney disease and hypertension.  She also takes chronic opioids for back pain with a history of osteomyelitis of the spine.  In the late evening hours of 3/22 she was unable to be aroused by her husband.  It is reported that she took opioid pain medications earlier in the night.  EMS was called and administered Narcan to which she had a positive response.    She was transported to the emergency department at [facility].  She was unable to give a good history at that time.  Upon arrival to the emergency department she was noted to be hypoxemic requiring supplemental oxygen.  Her oxygen saturation dropped as low as into the 60s at one point.  Laboratory evaluation was significant for creatinine of 3.5, potassium of greater than 7.5, high-sensitivity troponin of 68, AST 179, ALT 211, WBC 17.6, and hemoglobin 10.7.  CT of the head was unremarkable.  Initial COVID-19 antigen testing was negative.  Her mental status did improve after Narcan administration and with supplemental oxygen.  Her hyperkalemia was treated with temporizing measures as well as a one-time dose of Lokelma.  Given hyperkalemia and hypoxemia she was felt to be a candidate for admission to ICU and PCCM was consulted.  She is a bit more awake now, but still rather unclear of the events preceding her presentation tonight.  For example, she tells that she takes Percocet 3 times a day but  also tells that she has not taken it for several days.  History from her seems unreliable at this time.  Past Medical History  CKD, DM, GERD, Lyme disease, Bacteremia, Osteomyelitis of the spine, HTN.   Significant Hospital Events   3/22 admitted  Consults:  Nephrology  Procedures:  Right HD cath 3/23  Significant Diagnostic Tests:  CT head 3/23> no acute intracranial abnormality.  Chronic small vessel ischemia  Renal ultrasound 3/23 >> normal kidneys  Micro Data:  SARSCoV2 3/22 >> negative Flu A&B 3/22 >> negative MRSA screen 3/23 >> positive  Antimicrobials:  vanco 3/23 >>   Interim history/subjective:  Sitting up in bed with no acute complaints.   Objective   Blood pressure (!) 131/57, pulse 77, temperature 98.1 F (36.7 C), temperature source Axillary, resp. rate 15, height 6\' 2"  (1.88 m), weight (!) 146.4 kg, SpO2 90 %.        Intake/Output Summary (Last 24 hours) at 12/22/2019 0953 Last data filed at 12/22/2019 0900 Gross per 24 hour  Intake 1180.2 ml  Output 3544 ml  Net -2363.8 ml   Filed Weights   12/20/19 0500 12/21/19 0500 12/22/19 0500  Weight: (!) 145.4 kg (!) 148.8 kg (!) 146.4 kg    Examination: General: Chronically ill appearing elderly ffemale lying in bed, in NAD HEENT: [place]/AT, MM pink/moist, PERRL,  Neuro: Alert and oriented x3, non-focal  CV: s1s2 regular rate and rhythm, no murmur, rubs, or gallops,  PULM:  Clear to ascultation bilaterally, no increased work  of breathing,  GI: soft, bowel sounds active in all 4 quadrants, non-tender, non-distended Extremities: warm/dry, no edema  Skin: no rashes or lesions  Resolved Hospital Problem list   Hyperkalemia   Assessment & Plan:   Acute hypoxemic respiratory failure:  -COVID testing negative. ? component pulmonary edema, consider aspiration pneumonitis given encephalopathy on presentation.  Consider ALI Plan: Continue supplemental oxygen, requirements decreasing  Volume removal per  CVVHD Continue to follow clinically, no clear evidence t support bacterial pneumonia    Acute renal failure: Nonoliguric Superimposed on chronic kidney disease -CVVH initiated 3/23, tolerating -Renal ultrasound reassuring Plan: Nephrology following, appreciate assistance  Follow renal function / urine output Trend Bmet Avoid nephrotoxins, ensure adequate renal perfusion   Right foot wound diabetic ulcer -Noted on plain film imaging -Wound look stable, no signs of tracking Plan: Continue empiric vancomycin topped 3/25, transitioned to PO  Can likely defer MRI foot and ortho consult for now   Diabetes mellitus:  -Hemoglobin A1c 10.9.  Patient has an insulin pump at home Plan: Home Sitagliptin on hold SSI, resistant scale   Hypertension:  -Home medication list includes amlodipine and lasix Plan: Resume home Norvasc once able  Diuretics on hold   Chronic back pain:   -History of bacteremia and osteomyelitis of spine -Question accidental narcotic overdose at presentation. Plan: Minimize sedating meds  Home Lyrica, oxycodone, and trazadone on hold    Best practice:  Diet: carb modified renal diet,  Pain/Anxiety/Delirium protocol (if indicated): restart low dose oxycodone VAP protocol (if indicated): NA DVT prophylaxis: SQH GI prophylaxis: NA Glucose control: SSI Mobility: BR Code Status: FULL Family Communication: Will update spouse  Disposition: ICU  Labs   CBC: Recent Labs  Lab 12/19/19 2125 12/20/19 0823 12/21/19 0347 12/22/19 0352  WBC 17.6*  --  9.9 8.6  NEUTROABS 15.7*  --   --   --   HGB 10.7* 11.6* 9.7* 9.3*  HCT 35.7* 34.0* 31.6* 29.5*  MCV 98.9  --  99.7 96.1  PLT 292  --  245 443    Basic Metabolic Panel: Recent Labs  Lab 12/20/19 0528 12/20/19 0715 12/20/19 1258 12/20/19 1258 12/20/19 1645 12/20/19 1645 12/20/19 2115 12/21/19 0347 12/21/19 0741 12/21/19 1641 12/22/19 0352  NA 135   < > 136  --  137  --   --  136  --  136 135  K  6.6*   < > 6.5*   < > 6.2*   < > 5.4* 4.6 4.5 4.4 3.8  CL 100   < > 104  --  103  --   --  101  --  105 100  CO2 23   < > 22  --  25  --   --  27  --  27 26  GLUCOSE 261*   < > 179*  --  204*  --   --  129*  --  180* 193*  BUN 59*   < > 62*  --  60*  --   --  47*  --  40* 44*  CREATININE 3.70*   < > 3.77*  --  3.83*  --   --  3.09*  --  2.27* 2.09*  CALCIUM 8.2*   < > 7.4*  --  7.4*  --   --  7.4*  --  7.0* 7.4*  MG 2.2  --   --   --   --   --   --  2.2  --   --  2.0  PHOS  --   --   --   --   --   --   --  5.0*  --  3.8 2.9   < > = values in this interval not displayed.   GFR: Estimated Creatinine Clearance: 53.5 mL/min (A) (by C-G formula based on SCr of 2.09 mg/dL (H)). Recent Labs  Lab 12/19/19 2125 12/20/19 0528 12/21/19 0347 12/22/19 0352  PROCALCITON  --  1.32 1.17  --   WBC 17.6*  --  9.9 8.6    Liver Function Tests: Recent Labs  Lab 12/19/19 2125 12/19/19 2125 12/20/19 0400 12/20/19 1258 12/21/19 0347 12/21/19 1641 12/22/19 0352  AST 179*  --  227* 122*  --   --   --   ALT 211*  --  284* 211*  --   --   --   ALKPHOS 171*  --  173* 133*  --   --   --   BILITOT 1.0  --  0.8 0.6  --   --   --   PROT 6.9  --  7.4 6.3*  --   --   --   ALBUMIN 3.2*   < > 3.5 2.9* 2.9* 2.7* 2.8*   < > = values in this interval not displayed.   No results for input(s): LIPASE, AMYLASE in the last 168 hours. No results for input(s): AMMONIA in the last 168 hours.  ABG    Component Value Date/Time   PHART 7.216 (L) 12/20/2019 0823   PCO2ART 64.4 (H) 12/20/2019 0823   PO2ART 77.0 (L) 12/20/2019 0823   HCO3 26.3 12/20/2019 0823   TCO2 28 12/20/2019 0823   ACIDBASEDEF 3.0 (H) 12/20/2019 0823   O2SAT 92.0 12/20/2019 0823     Coagulation Profile: Recent Labs  Lab 12/20/19 0528  INR 1.2    Cardiac Enzymes: Recent Labs  Lab 12/20/19 1645  CKTOTAL 2,234*    HbA1C: Hgb A1c MFr Bld  Date/Time Value Ref Range Status  12/20/2019 05:28 AM 10.9 (H) 4.8 - 5.6 % Final     Comment:    (NOTE) Pre diabetes:          5.7%-6.4% Diabetes:              >6.4% Glycemic control for   <7.0% adults with diabetes     CBG: Recent Labs  Lab 12/21/19 1525 12/21/19 1921 12/21/19 2315 12/22/19 0322 12/22/19 0735  GLUCAP 169* 191* 217* 195* 165*     Critical care time:    Performed by: Johnsie Cancel  Total critical care time: 34 minutes  Critical care time was exclusive of separately billable procedures and treating other patients.  Critical care was necessary to treat or prevent imminent or life-threatening deterioration.  Critical care was time spent personally by me on the following activities: development of treatment plan with patient and/or surrogate as well as nursing, discussions with consultants, evaluation of patient's response to treatment, examination of patient, obtaining history from patient or surrogate, ordering and performing treatments and interventions, ordering and review of laboratory studies, ordering and review of radiographic studies, pulse oximetry and re-evaluation of patient's condition.  Johnsie Cancel, NP-C [facility] Contact / Pager information can be found on Amion  12/22/2019, 10:06 AM

## 2019-12-23 ENCOUNTER — Inpatient Hospital Stay (HOSPITAL_COMMUNITY): Payer: Medicare Other

## 2019-12-23 ENCOUNTER — Other Ambulatory Visit: Payer: Self-pay

## 2019-12-23 LAB — RENAL FUNCTION PANEL
Albumin: 3.1 g/dL — ABNORMAL LOW (ref 3.5–5.0)
Anion gap: 11 (ref 5–15)
BUN: 41 mg/dL — ABNORMAL HIGH (ref 6–20)
CO2: 26 mmol/L (ref 22–32)
Calcium: 8.2 mg/dL — ABNORMAL LOW (ref 8.9–10.3)
Chloride: 103 mmol/L (ref 98–111)
Creatinine, Ser: 1.8 mg/dL — ABNORMAL HIGH (ref 0.44–1.00)
GFR calc Af Amer: 37 mL/min — ABNORMAL LOW (ref >60)
GFR calc non Af Amer: 32 mL/min — ABNORMAL LOW (ref >60)
Glucose, Bld: 238 mg/dL — ABNORMAL HIGH (ref 70–99)
Phosphorus: 3.7 mg/dL (ref 2.5–4.6)
Potassium: 4.4 mmol/L (ref 3.5–5.1)
Sodium: 140 mmol/L (ref 135–145)

## 2019-12-23 LAB — GLUCOSE, CAPILLARY
Glucose-Capillary: 200 mg/dL — ABNORMAL HIGH (ref 70–99)
Glucose-Capillary: 225 mg/dL — ABNORMAL HIGH (ref 70–99)
Glucose-Capillary: 240 mg/dL — ABNORMAL HIGH (ref 70–99)
Glucose-Capillary: 357 mg/dL — ABNORMAL HIGH (ref 70–99)
Glucose-Capillary: 224 mg/dL — ABNORMAL HIGH (ref 70–99)

## 2019-12-23 LAB — CBC
HCT: 32.3 % — ABNORMAL LOW (ref 36.0–46.0)
Hemoglobin: 10.3 g/dL — ABNORMAL LOW (ref 12.0–15.0)
MCH: 29.8 pg (ref 26.0–34.0)
MCHC: 31.9 g/dL (ref 30.0–36.0)
MCV: 93.4 fL (ref 80.0–100.0)
Platelets: 255 10*3/uL (ref 150–400)
RBC: 3.46 MIL/uL — ABNORMAL LOW (ref 3.87–5.11)
RDW: 14.6 % (ref 11.5–15.5)
WBC: 9.7 10*3/uL (ref 4.0–10.5)
nRBC: 0.2 % (ref 0.0–0.2)

## 2019-12-23 LAB — MAGNESIUM: Magnesium: 2.3 mg/dL (ref 1.7–2.4)

## 2019-12-23 MED ORDER — TECHNETIUM TO 99M ALBUMIN AGGREGATED
1.5500 | Freq: Once | INTRAVENOUS | Status: AC | PRN
  Administered 2019-12-23: 1.55 via INTRAVENOUS

## 2019-12-23 NOTE — Progress Notes (Signed)
PROGRESS NOTE    Emma Patton  RCV:[id number] DOB: 07/19/1971 DOA: 12/19/2019 PCP: Garnetta Buddy, NP   Brief Narrative:  49 year old female with past medical history as below which is significant for chronic kidney disease and hypertension.  She also takes chronic opioids for back pain with a history of osteomyelitis of the spine.  In the late evening hours of 3/22 she was unable to be aroused by her husband.  It is reported that she took opioid pain medications earlier in the night.  EMS was called and administered Narcan to which she had a positive response. She was transported to the emergency department at [facility] She was unable to give a good history at that time.  Upon arrival to the emergency department she was noted to be hypoxemic requiring supplemental oxygen.  Her oxygen saturation dropped as low as into the 60s at one point.  Laboratory evaluation was significant for creatinine of 3.5, potassium of greater than 7.5, high-sensitivity troponin of 68, AST 179, ALT 211, WBC 17.6, and hemoglobin 10.7.  CT of the head was unremarkable.  Initial COVID-19 antigen testing was negative.  Her mental status did improve after Narcan administration and with supplemental oxygen.  Her hyperkalemia was treated with temporizing measures as well as a one-time dose of Lokelma.  Given hyperkalemia and hypoxemia she was felt to be a candidate for admission to ICU and PCCM was consulted.  Assessment & Plan:   Active Problems:   Acute renal failure (ARF) (HCC)   Acute hypoxemic respiratory failure (HCC)   Acute toxic encephalopathy in the setting of polypharmacy/accidental narcotic overdose with concurrent acute hypoxemic respiratory failure:  -Responded appropriately to Narcan in the field -COVID testing negative.  - Likely concurrent aspiration pneumonitis although without fever or leukocytosis negative procalcitonin no indication for antibiotics  -Continue to wean oxygen as tolerated   -Given elevated D-dimer will rule out PE with VQ scan; bilateral vascular ultrasound previously negative  Acute renal failure: Nonoliguric Concurrent hyperkalemia Superimposed on chronic kidney disease -CVVH per nephrology in the ICU, no longer requiring -Urine output improving, follow daily labs -Renal ultrasound reassuring -Ordering V/Q instead of CTA as above given patient's tenuous status although creatinine continues to improve daily currently 1.8  Superficial wound of the right foot, likely diabetic in nature.   Noted on plain film imaging Continue doxycycline -will need wound care and outpatient follow-up  Diabetes mellitus; insulin-dependent, uncontrolled:  Hemoglobin A1c 10.9.  Patient has an insulin pump at home Sitagliptin on hold On sliding scale insulin resistance scale  Hypertension essential:  Continue home amlodipine,  Hold home Lasix given acute kidney injury as above -appears euvolemic at this time  Chronic back pain:   History of bacteremia and osteomyelitis of spine Holding oxycodone, Lyrica, trazodone for now  DVT prophylaxis: Heparin Code Status: Full Family Communication: Patient to update Disposition Plan: Inpatient, pending further evaluation, continues to require supplemental oxygen well above baseline, further evaluation for hypoxia including VQ scan today.  Creatinine not yet resolved, pending physical therapy patient may be reasonable discharge for home with home health in the next 40 to 72 hours.   Consultants:   PCCM, nephrology  Procedures:   CVVH  Antimicrobials:  Doxycycline  Subjective: No acute issues or events overnight, patient feels markedly improved, not yet back to baseline but more awake alert oriented.  Reports she does not remember coming to the hospital or the initial day at the hospital but now feels quite well, somewhat weak  and lethargic but otherwise improving.  Denies chest pain, nausea, vomiting, diarrhea,  constipation, headache, fevers, chills.  Objective: Vitals:   12/23/19 0436 12/23/19 0437 12/23/19 0438 12/23/19 0439  BP: (!) 149/69     Pulse: 70 71 72 67  Resp: 17 14 12 13   Temp: 97.8 F (36.6 C)     TempSrc: Oral     SpO2: 97% 97% 96% 96%  Weight:  (!) 142.4 kg    Height:        Intake/Output Summary (Last 24 hours) at 12/23/2019 0750 Last data filed at 12/23/2019 0437 Gross per 24 hour  Intake 600 ml  Output 2226 ml  Net -1626 ml   Filed Weights   12/22/19 0500 12/22/19 1806 12/23/19 0437  Weight: (!) 146.4 kg (!) 142.7 kg (!) 142.4 kg    Examination:  General:  Pleasantly resting in bed, No acute distress. HEENT:  Normocephalic atraumatic.  Sclerae nonicteric, noninjected.  Extraocular movements intact bilaterally. Neck:  Without mass or deformity.  Trachea is midline. Lungs:  Clear to auscultate bilaterally without rhonchi, wheeze, or rales. Heart:  Regular rate and rhythm.  Without murmurs, rubs, or gallops. Abdomen:  Soft, nontender, nondistended.  Without guarding or rebound. Extremities: Without cyanosis, clubbing, edema, or obvious deformity. Vascular:  Dorsalis pedis and posterior tibial pulses palpable bilaterally. Skin:  Warm and dry, no erythema, no ulcerations.  Data Reviewed: I have personally reviewed following labs and imaging studies  CBC: Recent Labs  Lab 12/19/19 2125 12/20/19 0823 12/21/19 0347 12/22/19 0352 12/23/19 0449  WBC 17.6*  --  9.9 8.6 9.7  NEUTROABS 15.7*  --   --   --   --   HGB 10.7* 11.6* 9.7* 9.3* 10.3*  HCT 35.7* 34.0* 31.6* 29.5* 32.3*  MCV 98.9  --  99.7 96.1 93.4  PLT 292  --  245 229 884   Basic Metabolic Panel: Recent Labs  Lab 12/20/19 0528 12/20/19 0715 12/20/19 1645 12/20/19 2115 12/21/19 0347 12/21/19 0741 12/21/19 1641 12/22/19 0352 12/23/19 0449  NA 135   < > 137  --  136  --  136 135 140  K 6.6*   < > 6.2*   < > 4.6 4.5 4.4 3.8 4.4  CL 100   < > 103  --  101  --  105 100 103  CO2 23   < > 25  --   27  --  27 26 26   GLUCOSE 261*   < > 204*  --  129*  --  180* 193* 238*  BUN 59*   < > 60*  --  47*  --  40* 44* 41*  CREATININE 3.70*   < > 3.83*  --  3.09*  --  2.27* 2.09* 1.80*  CALCIUM 8.2*   < > 7.4*  --  7.4*  --  7.0* 7.4* 8.2*  MG 2.2  --   --   --  2.2  --   --  2.0 2.3  PHOS  --   --   --   --  5.0*  --  3.8 2.9 3.7   < > = values in this interval not displayed.   GFR: Estimated Creatinine Clearance: 59.5 mL/min (A) (by C-G formula based on SCr of 1.8 mg/dL (H)). Liver Function Tests: Recent Labs  Lab 12/19/19 2125 12/19/19 2125 12/20/19 0400 12/20/19 0400 12/20/19 1258 12/21/19 0347 12/21/19 1641 12/22/19 0352 12/23/19 0449  AST 179*  --  227*  --  122*  --   --   --   --  ALT 211*  --  284*  --  211*  --   --   --   --   ALKPHOS 171*  --  173*  --  133*  --   --   --   --   BILITOT 1.0  --  0.8  --  0.6  --   --   --   --   PROT 6.9  --  7.4  --  6.3*  --   --   --   --   ALBUMIN 3.2*   < > 3.5   < > 2.9* 2.9* 2.7* 2.8* 3.1*   < > = values in this interval not displayed.   No results for input(s): LIPASE, AMYLASE in the last 168 hours. No results for input(s): AMMONIA in the last 168 hours. Coagulation Profile: Recent Labs  Lab 12/20/19 0528  INR 1.2   Cardiac Enzymes: Recent Labs  Lab 12/20/19 1645  CKTOTAL 2,234*   BNP (last 3 results) No results for input(s): PROBNP in the last 8760 hours. HbA1C: No results for input(s): HGBA1C in the last 72 hours. CBG: Recent Labs  Lab 12/22/19 0735 12/22/19 1120 12/22/19 1552 12/22/19 2000 12/23/19 0441  GLUCAP 165* 168* 218* 275* 200*   Lipid Profile: No results for input(s): CHOL, HDL, LDLCALC, TRIG, CHOLHDL, LDLDIRECT in the last 72 hours. Thyroid Function Tests: No results for input(s): TSH, T4TOTAL, FREET4, T3FREE, THYROIDAB in the last 72 hours. Anemia Panel: No results for input(s): VITAMINB12, FOLATE, FERRITIN, TIBC, IRON, RETICCTPCT in the last 72 hours. Sepsis Labs: Recent Labs  Lab  12/20/19 0528 12/21/19 0347  PROCALCITON 1.32 1.17    Recent Results (from the past 240 hour(s))  SARS CORONAVIRUS 2 (TAT 6-24 HRS) Nasopharyngeal Nasopharyngeal Swab     Status: None   Collection Time: 12/19/19 11:29 PM   Specimen: Nasopharyngeal Swab  Result Value Ref Range Status   SARS Coronavirus 2 NEGATIVE NEGATIVE Final    Comment: (NOTE) SARS-CoV-2 target nucleic acids are NOT DETECTED. The SARS-CoV-2 RNA is generally detectable in upper and lower respiratory specimens during the acute phase of infection. Negative results do not preclude SARS-CoV-2 infection, do not rule out co-infections with other pathogens, and should not be used as the sole basis for treatment or other patient management decisions. Negative results must be combined with clinical observations, patient history, and epidemiological information. The expected result is Negative. Fact Sheet for Patients: SugarRoll.be Fact Sheet for Healthcare Providers: https://www.woods-mathews.com/ This test is not yet approved or cleared by the [place] FDA and  has been authorized for detection and/or diagnosis of SARS-CoV-2 by FDA under an Emergency Use Authorization (EUA). This EUA will remain  in effect (meaning this test can be used) for the duration of the COVID-19 declaration under Section 56 4(b)(1) of the Act, 21 U.S.C. section 360bbb-3(b)(1), unless the authorization is terminated or revoked sooner. Performed at [facility], [place] [address], [place], [place] [address]   Respiratory Panel by RT PCR (Flu A&B, Covid) - Nasopharyngeal Swab     Status: None   Collection Time: 12/19/19 11:29 PM   Specimen: Nasopharyngeal Swab  Result Value Ref Range Status   SARS Coronavirus 2 by RT PCR NEGATIVE NEGATIVE Final    Comment: (NOTE) SARS-CoV-2 target nucleic acids are NOT DETECTED. The SARS-CoV-2 RNA is generally detectable in upper respiratoy specimens during the  acute phase of infection. The lowest concentration of SARS-CoV-2 viral copies this assay can detect is 131 copies/mL.  A negative result does not preclude SARS-Cov-2 infection and should not be used as the sole basis for treatment or other patient management decisions. A negative result may occur with  improper specimen collection/handling, submission of specimen other than nasopharyngeal swab, presence of viral mutation(s) within the areas targeted by this assay, and inadequate number of viral copies (<131 copies/mL). A negative result must be combined with clinical observations, patient history, and epidemiological information. The expected result is Negative. Fact Sheet for Patients:  PinkCheek.be Fact Sheet for Healthcare Providers:  GravelBags.it This test is not yet ap proved or cleared by the [place] FDA and  has been authorized for detection and/or diagnosis of SARS-CoV-2 by FDA under an Emergency Use Authorization (EUA). This EUA will remain  in effect (meaning this test can be used) for the duration of the COVID-19 declaration under Section 564(b)(1) of the Act, 21 U.S.C. section 360bbb-3(b)(1), unless the authorization is terminated or revoked sooner.    Influenza A by PCR NEGATIVE NEGATIVE Final   Influenza B by PCR NEGATIVE NEGATIVE Final    Comment: (NOTE) The Xpert Xpress SARS-CoV-2/FLU/RSV assay is intended as an aid in  the diagnosis of influenza from Nasopharyngeal swab specimens and  should not be used as a sole basis for treatment. Nasal washings and  aspirates are unacceptable for Xpert Xpress SARS-CoV-2/FLU/RSV  testing. Fact Sheet for Patients: PinkCheek.be Fact Sheet for Healthcare Providers: GravelBags.it This test is not yet approved or cleared by the [place] FDA and  has been authorized for detection and/or diagnosis of SARS-CoV-2  by  FDA under an Emergency Use Authorization (EUA). This EUA will remain  in effect (meaning this test can be used) for the duration of the  Covid-19 declaration under Section 564(b)(1) of the Act, 21  U.S.C. section 360bbb-3(b)(1), unless the authorization is  terminated or revoked. Performed at [facility], [place] [address], [place], [place] [address]   MRSA PCR Screening     Status: Abnormal   Collection Time: 12/20/19  5:56 AM   Specimen: Nasopharyngeal  Result Value Ref Range Status   MRSA by PCR POSITIVE (A) NEGATIVE Final    Comment:        The GeneXpert MRSA Assay (FDA approved for NASAL specimens only), is one component of a comprehensive MRSA colonization surveillance program. It is not intended to diagnose MRSA infection nor to guide or monitor treatment for MRSA infections. RESULT CALLED TO, READ BACK BY AND VERIFIED WITH: ROBERTS,R RN 12/20/2019 AT 0706 [facility]          Radiology Studies: No results found.      Scheduled Meds: . amLODipine  5 mg Oral Daily  . atorvastatin  10 mg Oral Daily  . chlorhexidine  15 mL Mouth Rinse BID  . Chlorhexidine Gluconate Cloth  6 each Topical Q0600  . cholecalciferol  400 Units Oral Once per day on Mon Thu  . doxycycline  100 mg Oral Q12H  . ferrous sulfate  325 mg Oral Q breakfast  . heparin  5,000 Units Subcutaneous Q8H  . hydrocerin   Topical BID  . insulin aspart  0-20 Units Subcutaneous Q4H  . linaclotide  145 mcg Oral Daily  . loratadine  10 mg Oral Daily  . magnesium oxide  400 mg Oral Daily  . mouth rinse  15 mL Mouth Rinse q12n4p  . melatonin  3 mg Oral QHS  . mupirocin ointment  1 application Nasal BID  . pantoprazole  40 mg Oral  QHS  . polyethylene glycol  17 g Oral Daily  . pregabalin  75 mg Oral TID   Continuous Infusions:   LOS: 3 days    Time spent: 45 minutes   Little Ishikawa, DO [facility]  If 7PM-7AM, please contact night-coverage [email or web address]  12/23/2019,  7:50 AM

## 2019-12-23 NOTE — Progress Notes (Signed)
Inpatient Diabetes Program Recommendations  AACE/ADA: New Consensus Statement on Inpatient Glycemic Control (2015)  Target Ranges:  Prepandial:   less than 140 mg/dL      Peak postprandial:   less than 180 mg/dL (1-2 hours)      Critically ill patients:  140 - 180 mg/dL   Lab Results  Component Value Date   GLUCAP 240 (H) 12/23/2019   HGBA1C 10.9 (H) 12/20/2019    Spoke with patient about diabetes and home regimen for diabetes control. Patient reports that she is followed by her PCP [facility] in [place]. Last saw them 3 months ago. Pt reports changes were made to her insulin doses approx 2 weeks ago with minimal dosing changes. Pts did not see an improvement on glucose trends.  Pt is on an Omnipod insulin pump at home in addition to taking Januvia 25 mg Daily. Pt struggles with transportation and does not have anyone to bring it to the hospital.  Discussed with pt that we will give her insulin before she leaves to tie her over till she can place her pump once she gets home.  Discussed current A1c level of 10.9%. pt reports it was high at her doctors as well. Told pt she needs an Endocrinologist and to find someone who can take a trip to [place] every 3 months for this.  Discussed importance of checking CBGs and maintaining good CBG control to prevent long-term and short-term complications. Explained how hyperglycemia leads to damage within blood vessels which lead to the common complications seen with uncontrolled diabetes.  Patient verbalized understanding of information discussed and she states that she has no further questions at this time related to diabetes. Will attach list of local Endocrinologist to AVS.   Thanks,  Tama Headings RN, MSN, [facility] Inpatient Diabetes Coordinator Team Pager [phone] (8a-5p)

## 2019-12-23 NOTE — Plan of Care (Signed)
Patient Foley removed for acute decreased output and patient will have follow-up

## 2019-12-23 NOTE — Progress Notes (Signed)
Patient scant blood after urination twice patient maybe starting menstrual cycle

## 2019-12-23 NOTE — Progress Notes (Signed)
Patient Foley removed

## 2019-12-23 NOTE — Discharge Instructions (Signed)
Local Endocrinologists [facility] [phone]) 1. Dr. Philemon Kingdom 2. Dr. Janie [facility] [phone]) 1. Dr. Delrae Rend [facility] [phone]) 1. Dr. Jacelyn Pi 2. Dr. Anda Kraft [facility] [phone]) 1. Dr. Daneil Dolin [facility] [phone]) [[place] office]  [phone]) [[place] office] 1. Dr. Lenna Sciara [place] 2. Dr. Mee Hives [facility]) [phone]) 1. Autumn Hudnall Ronnald Ramp), PA 2. Dr. Amalia Greenhouse 3. Dr. Marsh Dolly. [facility] [phone]) 1. Dr. Glade Lloyd [facility] [phone]) 1. Dr. Orville Govern 2. Dr. Lelon Huh 3. Dr. Jerelene Redden 4. Alwyn Ren, FNP Dr. Carolynn Serve. [facility] in [place] [phone])

## 2019-12-24 LAB — MAGNESIUM: Magnesium: 2.4 mg/dL (ref 1.7–2.4)

## 2019-12-24 LAB — GLUCOSE, CAPILLARY
Glucose-Capillary: 139 mg/dL — ABNORMAL HIGH (ref 70–99)
Glucose-Capillary: 171 mg/dL — ABNORMAL HIGH (ref 70–99)
Glucose-Capillary: 191 mg/dL — ABNORMAL HIGH (ref 70–99)
Glucose-Capillary: 192 mg/dL — ABNORMAL HIGH (ref 70–99)
Glucose-Capillary: 208 mg/dL — ABNORMAL HIGH (ref 70–99)
Glucose-Capillary: 283 mg/dL — ABNORMAL HIGH (ref 70–99)
Glucose-Capillary: 303 mg/dL — ABNORMAL HIGH (ref 70–99)

## 2019-12-24 LAB — RENAL FUNCTION PANEL
Albumin: 3.1 g/dL — ABNORMAL LOW (ref 3.5–5.0)
Anion gap: 10 (ref 5–15)
BUN: 40 mg/dL — ABNORMAL HIGH (ref 6–20)
CO2: 27 mmol/L (ref 22–32)
Calcium: 8.4 mg/dL — ABNORMAL LOW (ref 8.9–10.3)
Chloride: 102 mmol/L (ref 98–111)
Creatinine, Ser: 1.68 mg/dL — ABNORMAL HIGH (ref 0.44–1.00)
GFR calc Af Amer: 41 mL/min — ABNORMAL LOW
GFR calc non Af Amer: 35 mL/min — ABNORMAL LOW
Glucose, Bld: 147 mg/dL — ABNORMAL HIGH (ref 70–99)
Phosphorus: 3.7 mg/dL (ref 2.5–4.6)
Potassium: 4.2 mmol/L (ref 3.5–5.1)
Sodium: 139 mmol/L (ref 135–145)

## 2019-12-24 LAB — CBC
HCT: 33.1 % — ABNORMAL LOW (ref 36.0–46.0)
Hemoglobin: 10.5 g/dL — ABNORMAL LOW (ref 12.0–15.0)
MCH: 29.8 pg (ref 26.0–34.0)
MCHC: 31.7 g/dL (ref 30.0–36.0)
MCV: 94 fL (ref 80.0–100.0)
Platelets: 249 K/uL (ref 150–400)
RBC: 3.52 MIL/uL — ABNORMAL LOW (ref 3.87–5.11)
RDW: 14.6 % (ref 11.5–15.5)
WBC: 10.6 K/uL — ABNORMAL HIGH (ref 4.0–10.5)
nRBC: 0.2 % (ref 0.0–0.2)

## 2019-12-24 NOTE — Progress Notes (Signed)
Wound care complete per order. No drainage or odor noted. Patient tolerated well.

## 2019-12-24 NOTE — Progress Notes (Signed)
SATURATION QUALIFICATIONS: (This note is used to comply with regulatory documentation for home oxygen)  Patient Saturations on Room Air at Rest = 87%  Patient Saturations on Room Air while Ambulating = 90%  Patient Saturations on 2 Liters of oxygen while Ambulating = 98%  Please briefly explain why patient needs home oxygen: 02 drops when sleep and  while ambulating to 90 then 93 sitting in chair.

## 2019-12-24 NOTE — Plan of Care (Signed)

## 2019-12-24 NOTE — Progress Notes (Addendum)
PROGRESS NOTE    Emma Patton  PYK:[id number] DOB: [birth year] DOA: 12/19/2019 PCP: Garnetta Buddy, NP   Brief Narrative:  49 year old female with past medical history as below which is significant for chronic kidney disease and hypertension.  She also takes chronic opioids for back pain with a history of osteomyelitis of the spine.  In the late evening hours of 3/22 she was unable to be aroused by her husband.  It is reported that she took opioid pain medications earlier in the night.  EMS was called and administered Narcan to which she had a positive response. She was transported to the emergency department at [facility]. She was unable to give a good history at that time.  Upon arrival to the emergency department she was noted to be hypoxemic requiring supplemental oxygen.  Her oxygen saturation dropped as low as into the 60s at one point.  Laboratory evaluation was significant for creatinine of 3.5, potassium of greater than 7.5, high-sensitivity troponin of 68, AST 179, ALT 211, WBC 17.6, and hemoglobin 10.7.  CT of the head was unremarkable.  Initial COVID-19 antigen testing was negative.  Her mental status did improve after Narcan administration and with supplemental oxygen.  Her hyperkalemia was treated with temporizing measures as well as a one-time dose of Lokelma.  Given hyperkalemia and hypoxemia she was felt to be a candidate for admission to ICU and PCCM was consulted.  Assessment & Plan:   Active Problems:   Acute renal failure (ARF) (HCC)   Acute hypoxemic respiratory failure (HCC)  Acute toxic encephalopathy in the setting of polypharmacy/accidental narcotic overdose with concurrent acute hypoxemic respiratory failure:  Improving -Responded appropriately to Narcan in the field -COVID testing negative.  - Likely concurrent aspiration pneumonitis although without fever or leukocytosis negative procalcitonin no indication for antibiotics  -Continue to wean oxygen as  tolerated, continue daily ambulatory O2 screens; oxygen weaned down appropriately over time -Patient hypoxic with ambulation today in the mid to high 80s but was able to maintain sats on 2 L nasal cannula, may be reasonable to discharge patient on oxygen with outpatient follow-up to wean. SpO2: 97 % O2 Flow Rate (L/min): 3 L/min -VQ negative; bilateral vascular ultrasound previously negative  Acute renal failure: Nonoliguric, improving Concurrent hyperkalemia, resolved Superimposed on chronic kidney disease -CVVH per nephrology in the ICU, no longer requiring -Urine output improving, follow daily labs -Renal ultrasound reassuring -Ordering V/Q instead of CTA as above given patient's tenuous status although creatinine continues to improve daily: Lab Results  Component Value Date   CREATININE 1.68 (H) 12/24/2019   CREATININE 1.80 (H) 12/23/2019   CREATININE 2.09 (H) 12/22/2019    Ambulatory dysfunction, likely secondary to above and prolonged bedbound status  -Patient continues to be evaluated by PT OT, patient markedly weak on exam today states she feels minimally improved but not yet back to baseline -Patient is concerned she will not be able to return home as she feels she will be unable to perform ADLs or ambulate safely at home without assistance -PT OT following, current disposition likely SNF versus home with home health pending clinical status over the next few days   Superficial wound of the right foot, likely diabetic in nature.   Noted on plain film imaging Continue doxycycline -will need wound care and outpatient follow-up  Diabetes mellitus; insulin-dependent, uncontrolled:  Hemoglobin A1c 10.9.  Patient has an insulin pump at home Sitagliptin on hold On sliding scale insulin resistance scale  Hypertension essential:  Continue home amlodipine,  Hold home Lasix given acute kidney injury as above -appears euvolemic at this time  Chronic back pain:   History of  bacteremia and osteomyelitis of spine Holding oxycodone, Lyrica, trazodone for now  DVT prophylaxis: Heparin Code Status: Full Family Communication: Patient to update Disposition Plan: Patient currently remains as inpatient, pending further evaluation, continues to require supplemental oxygen well above baseline, further evaluation for hypoxia, Creatinine not yet resolved, pending physical therapy patient may be reasonable discharge for SNF versus home with home health in the next 48 to 72 hours pending clinical course, recommendations and bed availability.  Consultants:   PCCM, nephrology  Procedures:   CVVH  Antimicrobials:  Doxycycline  Subjective: No acute issues or events overnight, patient continues to feel her respiratory status is improving but now complaining of marked weakness in her lower extremities with ambulation, she had not previously ambulated since admission discussion did not realize how weak she was going to be.  She is currently considering SNF placement at discharge.  She declines chest pain, nausea, vomiting, diarrhea, constipation, headache, fevers, chills.  Objective: Vitals:   12/23/19 2010 12/24/19 0041 12/24/19 0052 12/24/19 0423  BP: (!) 142/70 139/69  (!) 150/78  Pulse: 64 (!) 59  68  Resp: 17 18  18   Temp: 98.1 F (36.7 C) 98.3 F (36.8 C)  97.7 F (36.5 C)  TempSrc: Oral Oral  Oral  SpO2:  97%  97%  Weight:   (!) 142.9 kg   Height:        Intake/Output Summary (Last 24 hours) at 12/24/2019 0720 Last data filed at 12/24/2019 0600 Gross per 24 hour  Intake 1000 ml  Output 400 ml  Net 600 ml   Filed Weights   12/22/19 1806 12/23/19 0437 12/24/19 0052  Weight: (!) 142.7 kg (!) 142.4 kg (!) 142.9 kg    Examination:  General:  Pleasantly resting in bed, No acute distress. HEENT:  Normocephalic atraumatic.  Sclerae nonicteric, noninjected.  Extraocular movements intact bilaterally. Neck:  Without mass or deformity.  Trachea is  midline. Lungs:  Clear to auscultate bilaterally without rhonchi, wheeze, or rales. Heart:  Regular rate and rhythm.  Without murmurs, rubs, or gallops. Abdomen:  Soft, nontender, nondistended.  Without guarding or rebound. Extremities: Without cyanosis, clubbing, edema, or obvious deformity.  4 out of 5 strength globally.   Vascular:  Dorsalis pedis and posterior tibial pulses palpable bilaterally. Skin:  Warm and dry, no erythema, no ulcerations.  Data Reviewed: I have personally reviewed following labs and imaging studies  CBC: Recent Labs  Lab 12/19/19 2125 12/19/19 2125 12/20/19 0823 12/21/19 0347 12/22/19 0352 12/23/19 0449 12/24/19 0510  WBC 17.6*  --   --  9.9 8.6 9.7 10.6*  NEUTROABS 15.7*  --   --   --   --   --   --   HGB 10.7*   < > 11.6* 9.7* 9.3* 10.3* 10.5*  HCT 35.7*   < > 34.0* 31.6* 29.5* 32.3* 33.1*  MCV 98.9  --   --  99.7 96.1 93.4 94.0  PLT 292  --   --  245 229 255 249   < > = values in this interval not displayed.   Basic Metabolic Panel: Recent Labs  Lab 12/20/19 0528 12/20/19 0715 12/21/19 0347 12/21/19 0347 12/21/19 0741 12/21/19 1641 12/22/19 0352 12/23/19 0449 12/24/19 0510  NA 135   < > 136  --   --  136 135 140 139  K  6.6*   < > 4.6   < > 4.5 4.4 3.8 4.4 4.2  CL 100   < > 101  --   --  105 100 103 102  CO2 23   < > 27  --   --  27 26 26 27   GLUCOSE 261*   < > 129*  --   --  180* 193* 238* 147*  BUN 59*   < > 47*  --   --  40* 44* 41* 40*  CREATININE 3.70*   < > 3.09*  --   --  2.27* 2.09* 1.80* 1.68*  CALCIUM 8.2*   < > 7.4*  --   --  7.0* 7.4* 8.2* 8.4*  MG 2.2  --  2.2  --   --   --  2.0 2.3 2.4  PHOS  --   --  5.0*  --   --  3.8 2.9 3.7 3.7   < > = values in this interval not displayed.   GFR: Estimated Creatinine Clearance: 63.9 mL/min (A) (by C-G formula based on SCr of 1.68 mg/dL (H)). Liver Function Tests: Recent Labs  Lab 12/19/19 2125 12/19/19 2125 12/20/19 0400 12/20/19 0400 12/20/19 1258 12/20/19 1258  12/21/19 0347 12/21/19 1641 12/22/19 0352 12/23/19 0449 12/24/19 0510  AST 179*  --  227*  --  122*  --   --   --   --   --   --   ALT 211*  --  284*  --  211*  --   --   --   --   --   --   ALKPHOS 171*  --  173*  --  133*  --   --   --   --   --   --   BILITOT 1.0  --  0.8  --  0.6  --   --   --   --   --   --   PROT 6.9  --  7.4  --  6.3*  --   --   --   --   --   --   ALBUMIN 3.2*   < > 3.5   < > 2.9*   < > 2.9* 2.7* 2.8* 3.1* 3.1*   < > = values in this interval not displayed.   No results for input(s): LIPASE, AMYLASE in the last 168 hours. No results for input(s): AMMONIA in the last 168 hours. Coagulation Profile: Recent Labs  Lab 12/20/19 0528  INR 1.2   Cardiac Enzymes: Recent Labs  Lab 12/20/19 1645  CKTOTAL 2,234*   BNP (last 3 results) No results for input(s): PROBNP in the last 8760 hours. HbA1C: No results for input(s): HGBA1C in the last 72 hours. CBG: Recent Labs  Lab 12/23/19 1145 12/23/19 1702 12/23/19 2121 12/24/19 0030 12/24/19 0444  GLUCAP 240* 357* 224* 171* 139*   Lipid Profile: No results for input(s): CHOL, HDL, LDLCALC, TRIG, CHOLHDL, LDLDIRECT in the last 72 hours. Thyroid Function Tests: No results for input(s): TSH, T4TOTAL, FREET4, T3FREE, THYROIDAB in the last 72 hours. Anemia Panel: No results for input(s): VITAMINB12, FOLATE, FERRITIN, TIBC, IRON, RETICCTPCT in the last 72 hours. Sepsis Labs: Recent Labs  Lab 12/20/19 0528 12/21/19 0347  PROCALCITON 1.32 1.17    Recent Results (from the past 240 hour(s))  SARS CORONAVIRUS 2 (TAT 6-24 HRS) Nasopharyngeal Nasopharyngeal Swab     Status: None   Collection Time: 12/19/19 11:29 PM   Specimen: Nasopharyngeal Swab  Result Value Ref Range Status   SARS Coronavirus 2 NEGATIVE NEGATIVE Final    Comment: (NOTE) SARS-CoV-2 target nucleic acids are NOT DETECTED. The SARS-CoV-2 RNA is generally detectable in upper and lower respiratory specimens during the acute phase of infection.  Negative results do not preclude SARS-CoV-2 infection, do not rule out co-infections with other pathogens, and should not be used as the sole basis for treatment or other patient management decisions. Negative results must be combined with clinical observations, patient history, and epidemiological information. The expected result is Negative. Fact Sheet for Patients: SugarRoll.be Fact Sheet for Healthcare Providers: https://www.woods-mathews.com/ This test is not yet approved or cleared by the [place] FDA and  has been authorized for detection and/or diagnosis of SARS-CoV-2 by FDA under an Emergency Use Authorization (EUA). This EUA will remain  in effect (meaning this test can be used) for the duration of the COVID-19 declaration under Section 56 4(b)(1) of the Act, 21 U.S.C. section 360bbb-3(b)(1), unless the authorization is terminated or revoked sooner. Performed at [facility], [place] [address], [place], [place] [address]   Respiratory Panel by RT PCR (Flu A&B, Covid) - Nasopharyngeal Swab     Status: None   Collection Time: 12/19/19 11:29 PM   Specimen: Nasopharyngeal Swab  Result Value Ref Range Status   SARS Coronavirus 2 by RT PCR NEGATIVE NEGATIVE Final    Comment: (NOTE) SARS-CoV-2 target nucleic acids are NOT DETECTED. The SARS-CoV-2 RNA is generally detectable in upper respiratoy specimens during the acute phase of infection. The lowest concentration of SARS-CoV-2 viral copies this assay can detect is 131 copies/mL. A negative result does not preclude SARS-Cov-2 infection and should not be used as the sole basis for treatment or other patient management decisions. A negative result may occur with  improper specimen collection/handling, submission of specimen other than nasopharyngeal swab, presence of viral mutation(s) within the areas targeted by this assay, and inadequate number of viral copies (<131 copies/mL). A  negative result must be combined with clinical observations, patient history, and epidemiological information. The expected result is Negative. Fact Sheet for Patients:  PinkCheek.be Fact Sheet for Healthcare Providers:  GravelBags.it This test is not yet ap proved or cleared by the [place] FDA and  has been authorized for detection and/or diagnosis of SARS-CoV-2 by FDA under an Emergency Use Authorization (EUA). This EUA will remain  in effect (meaning this test can be used) for the duration of the COVID-19 declaration under Section 564(b)(1) of the Act, 21 U.S.C. section 360bbb-3(b)(1), unless the authorization is terminated or revoked sooner.    Influenza A by PCR NEGATIVE NEGATIVE Final   Influenza B by PCR NEGATIVE NEGATIVE Final    Comment: (NOTE) The Xpert Xpress SARS-CoV-2/FLU/RSV assay is intended as an aid in  the diagnosis of influenza from Nasopharyngeal swab specimens and  should not be used as a sole basis for treatment. Nasal washings and  aspirates are unacceptable for Xpert Xpress SARS-CoV-2/FLU/RSV  testing. Fact Sheet for Patients: PinkCheek.be Fact Sheet for Healthcare Providers: GravelBags.it This test is not yet approved or cleared by the [place] FDA and  has been authorized for detection and/or diagnosis of SARS-CoV-2 by  FDA under an Emergency Use Authorization (EUA). This EUA will remain  in effect (meaning this test can be used) for the duration of the  Covid-19 declaration under Section 564(b)(1) of the Act, 21  U.S.C. section 360bbb-3(b)(1), unless the authorization is  terminated or revoked. Performed at [facility], [address], [place], [place] [address]   MRSA PCR Screening     Status: Abnormal   Collection Time: 12/20/19  5:56 AM   Specimen: Nasopharyngeal  Result Value Ref Range Status   MRSA by PCR POSITIVE  (A) NEGATIVE Final    Comment:        The GeneXpert MRSA Assay (FDA approved for NASAL specimens only), is one component of a comprehensive MRSA colonization surveillance program. It is not intended to diagnose MRSA infection nor to guide or monitor treatment for MRSA infections. RESULT CALLED TO, READ BACK BY AND VERIFIED WITH: ROBERTS,R RN 12/20/2019 AT [address] [facility]      Radiology Studies: DG Chest 2 View  Result Date: 12/23/2019 CLINICAL DATA:  Shortness of breath and loss of consciousness EXAM: CHEST - 2 VIEW COMPARISON:  12/20/2019 FINDINGS: Cardiac shadow is mildly enlarged but stable. Right jugular catheter has been removed in the interval. Improved aeration is noted when compare with the prior exam although some mild residual vascular congestion remains. No new infiltrate or effusion is seen. No bony abnormality is noted. IMPRESSION: Improved aeration although some mild vascular congestion remains. Electronically Signed   By: Inez Catalina M.D.   On: 12/23/2019 13:21   NM Pulmonary Perfusion  Result Date: 12/23/2019 CLINICAL DATA:  Concern for pulmonary embolism.  Positive D-dimer. EXAM: NUCLEAR MEDICINE PERFUSION LUNG SCAN TECHNIQUE: Perfusion images were obtained in multiple projections after intravenous injection of radiopharmaceutical. Ventilation scans intentionally deferred if perfusion scan and chest x-ray adequate for interpretation during COVID 19 epidemic. RADIOPHARMACEUTICALS:  1.55 mCi Tc-3m MAA IV COMPARISON:  Chest radiograph 12/23/2019 FINDINGS: No wedge-shaped peripheral perfusion defects within the LEFT or RIGHT lung to suggest acute pulmonary embolism. IMPRESSION: No evidence acute pulmonary embolism. Electronically Signed   By: Suzy Bouchard M.D.   On: 12/23/2019 17:14   Scheduled Meds: . amLODipine  5 mg Oral Daily  . atorvastatin  10 mg Oral Daily  . chlorhexidine  15 mL Mouth Rinse BID  . Chlorhexidine Gluconate Cloth  6 each Topical Q0600  .  cholecalciferol  400 Units Oral Once per day on Mon Thu  . doxycycline  100 mg Oral Q12H  . ferrous sulfate  325 mg Oral Q breakfast  . heparin  5,000 Units Subcutaneous Q8H  . hydrocerin   Topical BID  . insulin aspart  0-20 Units Subcutaneous Q4H  . linaclotide  145 mcg Oral Daily  . loratadine  10 mg Oral Daily  . magnesium oxide  400 mg Oral Daily  . mouth rinse  15 mL Mouth Rinse q12n4p  . melatonin  3 mg Oral QHS  . mupirocin ointment  1 application Nasal BID  . pantoprazole  40 mg Oral QHS  . polyethylene glycol  17 g Oral Daily  . pregabalin  75 mg Oral TID   Continuous Infusions:   LOS: 4 days   Time spent: 45 minutes   Little Ishikawa, DO [facility]  If 7PM-7AM, please contact night-coverage [email or web address]  12/24/2019, 7:20 AM

## 2019-12-25 LAB — RENAL FUNCTION PANEL
Albumin: 3 g/dL — ABNORMAL LOW (ref 3.5–5.0)
Anion gap: 8 (ref 5–15)
BUN: 39 mg/dL — ABNORMAL HIGH (ref 6–20)
CO2: 27 mmol/L (ref 22–32)
Calcium: 8.6 mg/dL — ABNORMAL LOW (ref 8.9–10.3)
Chloride: 106 mmol/L (ref 98–111)
Creatinine, Ser: 1.51 mg/dL — ABNORMAL HIGH (ref 0.44–1.00)
GFR calc Af Amer: 46 mL/min — ABNORMAL LOW (ref >60)
GFR calc non Af Amer: 40 mL/min — ABNORMAL LOW (ref >60)
Glucose, Bld: 180 mg/dL — ABNORMAL HIGH (ref 70–99)
Phosphorus: 3.9 mg/dL (ref 2.5–4.6)
Potassium: 4.4 mmol/L (ref 3.5–5.1)
Sodium: 141 mmol/L (ref 135–145)

## 2019-12-25 LAB — CBC
HCT: 33.2 % — ABNORMAL LOW (ref 36.0–46.0)
Hemoglobin: 10.5 g/dL — ABNORMAL LOW (ref 12.0–15.0)
MCH: 29.7 pg (ref 26.0–34.0)
MCHC: 31.6 g/dL (ref 30.0–36.0)
MCV: 94.1 fL (ref 80.0–100.0)
Platelets: 230 10*3/uL (ref 150–400)
RBC: 3.53 MIL/uL — ABNORMAL LOW (ref 3.87–5.11)
RDW: 14.6 % (ref 11.5–15.5)
WBC: 11.2 10*3/uL — ABNORMAL HIGH (ref 4.0–10.5)
nRBC: 0 % (ref 0.0–0.2)

## 2019-12-25 LAB — MAGNESIUM: Magnesium: 2.4 mg/dL (ref 1.7–2.4)

## 2019-12-25 LAB — GLUCOSE, CAPILLARY
Glucose-Capillary: 186 mg/dL — ABNORMAL HIGH (ref 70–99)
Glucose-Capillary: 166 mg/dL — ABNORMAL HIGH (ref 70–99)
Glucose-Capillary: 182 mg/dL — ABNORMAL HIGH (ref 70–99)
Glucose-Capillary: 288 mg/dL — ABNORMAL HIGH (ref 70–99)
Glucose-Capillary: 304 mg/dL — ABNORMAL HIGH (ref 70–99)

## 2019-12-25 NOTE — Plan of Care (Signed)
  Problem: Education: Goal: Knowledge of General Education information will improve Description: Including pain rating scale, medication(s)/side effects and non-pharmacologic comfort measures Outcome: Adequate for Discharge   Problem: Health Behavior/Discharge Planning: Goal: Ability to manage health-related needs will improve Outcome: Adequate for Discharge   Problem: Clinical Measurements: Goal: Respiratory complications will improve Outcome: Adequate for Discharge   Problem: Activity: Goal: Risk for activity intolerance will decrease Outcome: Adequate for Discharge

## 2019-12-25 NOTE — Progress Notes (Signed)
PROGRESS NOTE    Emma Patton  TFT:[id number] DOB: 08/24/1971 DOA: 12/19/2019 PCP: Garnetta Buddy, NP   Brief Narrative:  49 year old female with past medical history as below which is significant for chronic kidney disease and hypertension.  She also takes chronic opioids for back pain with a history of osteomyelitis of the spine.  In the late evening hours of 3/22 she was unable to be aroused by her husband.  It is reported that she took opioid pain medications earlier in the night.  EMS was called and administered Narcan to which she had a positive response. She was transported to the emergency department at [facility]. She was unable to give a good history at that time.  Upon arrival to the emergency department she was noted to be hypoxemic requiring supplemental oxygen.  Her oxygen saturation dropped as low as into the 60s at one point.  Laboratory evaluation was significant for creatinine of 3.5, potassium of greater than 7.5, high-sensitivity troponin of 68, AST 179, ALT 211, WBC 17.6, and hemoglobin 10.7.  CT of the head was unremarkable.  Initial COVID-19 antigen testing was negative.  Her mental status did improve after Narcan administration and with supplemental oxygen.  Her hyperkalemia was treated with temporizing measures as well as a one-time dose of Lokelma.  Given hyperkalemia and hypoxemia she was felt to be a candidate for admission to ICU and PCCM was consulted.  Assessment & Plan:   Active Problems:   Acute renal failure (ARF) (HCC)   Acute hypoxemic respiratory failure (HCC)    Acute toxic encephalopathy in the setting of polypharmacy/accidental narcotic overdose with concurrent acute hypoxemic respiratory failure:  Improving -Responded appropriately to Narcan in the field -COVID testing negative.  - Likely concurrent aspiration pneumonitis although without fever or leukocytosis negative procalcitonin no indication for antibiotics  -Continue to wean oxygen  as tolerated, continue daily ambulatory O2 screens; oxygen weaned down appropriately over time -Patient hypoxic with ambulation today in the mid to high 80s but was able to maintain sats on 2 L nasal cannula, may be reasonable to discharge patient on oxygen with outpatient follow-up to wean. SpO2: 94 % O2 Flow Rate (L/min): 3 L/min -VQ negative; bilateral vascular ultrasound previously negative  Acute renal failure: Nonoliguric, improving Concurrent hyperkalemia, resolved Superimposed on chronic kidney disease -CVVH per nephrology in the ICU, no longer requiring -Urine output improving, follow daily labs -Renal ultrasound reassuring -VQ negative for PE as above, creatinine continues improve daily: Lab Results  Component Value Date   CREATININE 1.51 (H) 12/25/2019   CREATININE 1.68 (H) 12/24/2019   CREATININE 1.80 (H) 12/23/2019    Ambulatory dysfunction, likely acute on chronic secondary to above and prolonged bedbound status  - Patient markedly weak on exam today states she feels minimally improved but not yet back to baseline - Baseline ambulation appears to be weak at home requiring walker - Patient is concerned she will not be able to return home as she feels she will be unable to perform ADLs or ambulate safely at home without assistance -PT OT ordered, current disposition likely SNF versus home with home health pending clinical status over the next few days  Superficial wound of the right foot, likely diabetic in nature.   Noted on plain film imaging Continue doxycycline -will need wound care and outpatient follow-up  Diabetes mellitus; insulin-dependent, uncontrolled:  Hemoglobin A1c 10.9.  Patient has an insulin pump at home Sitagliptin on hold On sliding scale insulin resistance scale  Hypertension  essential:  Continue home amlodipine,  Hold home Lasix given acute kidney injury as above -appears euvolemic at this time  Chronic back pain:   History of bacteremia and  osteomyelitis of spine Holding oxycodone, Lyrica, trazodone for now  DVT prophylaxis: Heparin Code Status: Full Family Communication: Patient to update Disposition Plan: Patient currently remains as inpatient, pending further evaluation, continues to require supplemental oxygen well above baseline, further evaluation for hypoxia, Creatinine not yet resolved, pending physical therapy patient may be reasonable discharge for SNF versus home with home health in the next 48 to 72 hours pending clinical course, recommendations and bed availability.  Consultants:   PCCM, nephrology  Procedures:   CVVH  Antimicrobials:  Doxycycline  Subjective: No acute issues or events overnight, patient feeling much improved since admission, nearly back to baseline, still feels somewhat weak and unstable with ambulation but uses a walker at home, mild hypoxia and dyspnea with ambulation today we discussed possible physical therapy at home versus needing discharge to facility.  Patient is agreeable to both avenues, formal evaluation with PT pending.  Objective: Vitals:   12/25/19 0004 12/25/19 0413 12/25/19 0611 12/25/19 1007  BP: (!) 149/73 (!) 158/71  (!) 125/50  Pulse: (!) 59 64  68  Resp: 20 20  18   Temp: 97.7 F (36.5 C) 97.7 F (36.5 C)  (!) 97.5 F (36.4 C)  TempSrc: Oral Oral  Oral  SpO2: 97% 96%  94%  Weight: (!) 142.5 kg  (!) 142.5 kg   Height:        Intake/Output Summary (Last 24 hours) at 12/25/2019 1359 Last data filed at 12/25/2019 1227 Gross per 24 hour  Intake 300 ml  Output 1300 ml  Net -1000 ml   Filed Weights   12/24/19 0052 12/25/19 0004 12/25/19 0611  Weight: (!) 142.9 kg (!) 142.5 kg (!) 142.5 kg    Examination:  General:  Pleasantly resting in bed, No acute distress. HEENT:  Normocephalic atraumatic.  Sclerae nonicteric, noninjected.  Extraocular movements intact bilaterally. Neck:  Without mass or deformity.  Trachea is midline. Lungs:  Clear to auscultate  bilaterally without rhonchi, wheeze, or rales. Heart:  Regular rate and rhythm.  Without murmurs, rubs, or gallops. Abdomen:  Soft, nontender, nondistended.  Without guarding or rebound. Extremities: Without cyanosis, clubbing, edema, or obvious deformity.  4 out of 5 strength globally.   Vascular:  Dorsalis pedis and posterior tibial pulses palpable bilaterally. Skin:  Warm and dry, no erythema, no ulcerations.  Data Reviewed: I have personally reviewed following labs and imaging studies  CBC: Recent Labs  Lab 12/19/19 2125 12/20/19 0823 12/21/19 0347 12/22/19 0352 12/23/19 0449 12/24/19 0510 12/25/19 0731  WBC 17.6*  --  9.9 8.6 9.7 10.6* 11.2*  NEUTROABS 15.7*  --   --   --   --   --   --   HGB 10.7*   < > 9.7* 9.3* 10.3* 10.5* 10.5*  HCT 35.7*   < > 31.6* 29.5* 32.3* 33.1* 33.2*  MCV 98.9  --  99.7 96.1 93.4 94.0 94.1  PLT 292  --  245 229 255 249 230   < > = values in this interval not displayed.   Basic Metabolic Panel: Recent Labs  Lab 12/21/19 0347 12/21/19 0741 12/21/19 1641 12/22/19 0352 12/23/19 0449 12/24/19 0510 12/25/19 0731  NA 136   < > 136 135 140 139 141  K 4.6   < > 4.4 3.8 4.4 4.2 4.4  CL 101   < >  105 100 103 102 106  CO2 27   < > 27 26 26 27 27   GLUCOSE 129*   < > 180* 193* 238* 147* 180*  BUN 47*   < > 40* 44* 41* 40* 39*  CREATININE 3.09*   < > 2.27* 2.09* 1.80* 1.68* 1.51*  CALCIUM 7.4*   < > 7.0* 7.4* 8.2* 8.4* 8.6*  MG 2.2  --   --  2.0 2.3 2.4 2.4  PHOS 5.0*   < > 3.8 2.9 3.7 3.7 3.9   < > = values in this interval not displayed.   GFR: Estimated Creatinine Clearance: 71 mL/min (A) (by C-G formula based on SCr of 1.51 mg/dL (H)). Liver Function Tests: Recent Labs  Lab 12/19/19 2125 12/19/19 2125 12/20/19 0400 12/20/19 0400 12/20/19 1258 12/21/19 0347 12/21/19 1641 12/22/19 0352 12/23/19 0449 12/24/19 0510 12/25/19 0731  AST 179*  --  227*  --  122*  --   --   --   --   --   --   ALT 211*  --  284*  --  211*  --   --   --    --   --   --   ALKPHOS 171*  --  173*  --  133*  --   --   --   --   --   --   BILITOT 1.0  --  0.8  --  0.6  --   --   --   --   --   --   PROT 6.9  --  7.4  --  6.3*  --   --   --   --   --   --   ALBUMIN 3.2*   < > 3.5   < > 2.9*   < > 2.7* 2.8* 3.1* 3.1* 3.0*   < > = values in this interval not displayed.   No results for input(s): LIPASE, AMYLASE in the last 168 hours. No results for input(s): AMMONIA in the last 168 hours. Coagulation Profile: Recent Labs  Lab 12/20/19 0528  INR 1.2   Cardiac Enzymes: Recent Labs  Lab 12/20/19 1645  CKTOTAL 2,234*   BNP (last 3 results) No results for input(s): PROBNP in the last 8760 hours. HbA1C: No results for input(s): HGBA1C in the last 72 hours. CBG: Recent Labs  Lab 12/24/19 2134 12/24/19 2326 12/25/19 0403 12/25/19 0813 12/25/19 1200  GLUCAP 283* 208* 186* 166* 182*   Lipid Profile: No results for input(s): CHOL, HDL, LDLCALC, TRIG, CHOLHDL, LDLDIRECT in the last 72 hours. Thyroid Function Tests: No results for input(s): TSH, T4TOTAL, FREET4, T3FREE, THYROIDAB in the last 72 hours. Anemia Panel: No results for input(s): VITAMINB12, FOLATE, FERRITIN, TIBC, IRON, RETICCTPCT in the last 72 hours. Sepsis Labs: Recent Labs  Lab 12/20/19 0528 12/21/19 0347  PROCALCITON 1.32 1.17    Recent Results (from the past 240 hour(s))  SARS CORONAVIRUS 2 (TAT 6-24 HRS) Nasopharyngeal Nasopharyngeal Swab     Status: None   Collection Time: 12/19/19 11:29 PM   Specimen: Nasopharyngeal Swab  Result Value Ref Range Status   SARS Coronavirus 2 NEGATIVE NEGATIVE Final    Comment: (NOTE) SARS-CoV-2 target nucleic acids are NOT DETECTED. The SARS-CoV-2 RNA is generally detectable in upper and lower respiratory specimens during the acute phase of infection. Negative results do not preclude SARS-CoV-2 infection, do not rule out co-infections with other pathogens, and should not be used as the sole basis for treatment or  other patient  management decisions. Negative results must be combined with clinical observations, patient history, and epidemiological information. The expected result is Negative. Fact Sheet for Patients: SugarRoll.be Fact Sheet for Healthcare Providers: https://www.woods-mathews.com/ This test is not yet approved or cleared by the [place] FDA and  has been authorized for detection and/or diagnosis of SARS-CoV-2 by FDA under an Emergency Use Authorization (EUA). This EUA will remain  in effect (meaning this test can be used) for the duration of the COVID-19 declaration under Section 56 4(b)(1) of the Act, 21 U.S.C. section 360bbb-3(b)(1), unless the authorization is terminated or revoked sooner. Performed at [facility], [place] [address] [place], [place] [address]   Respiratory Panel by RT PCR (Flu A&B, Covid) - Nasopharyngeal Swab     Status: None   Collection Time: 12/19/19 11:29 PM   Specimen: Nasopharyngeal Swab  Result Value Ref Range Status   SARS Coronavirus 2 by RT PCR NEGATIVE NEGATIVE Final    Comment: (NOTE) SARS-CoV-2 target nucleic acids are NOT DETECTED. The SARS-CoV-2 RNA is generally detectable in upper respiratoy specimens during the acute phase of infection. The lowest concentration of SARS-CoV-2 viral copies this assay can detect is 131 copies/mL. A negative result does not preclude SARS-Cov-2 infection and should not be used as the sole basis for treatment or other patient management decisions. A negative result may occur with  improper specimen collection/handling, submission of specimen other than nasopharyngeal swab, presence of viral mutation(s) within the areas targeted by this assay, and inadequate number of viral copies (<131 copies/mL). A negative result must be combined with clinical observations, patient history, and epidemiological information. The expected result is Negative. Fact Sheet for Patients:   PinkCheek.be Fact Sheet for Healthcare Providers:  GravelBags.it This test is not yet ap proved or cleared by the [place] FDA and  has been authorized for detection and/or diagnosis of SARS-CoV-2 by FDA under an Emergency Use Authorization (EUA). This EUA will remain  in effect (meaning this test can be used) for the duration of the COVID-19 declaration under Section 564(b)(1) of the Act, 21 U.S.C. section 360bbb-3(b)(1), unless the authorization is terminated or revoked sooner.    Influenza A by PCR NEGATIVE NEGATIVE Final   Influenza B by PCR NEGATIVE NEGATIVE Final    Comment: (NOTE) The Xpert Xpress SARS-CoV-2/FLU/RSV assay is intended as an aid in  the diagnosis of influenza from Nasopharyngeal swab specimens and  should not be used as a sole basis for treatment. Nasal washings and  aspirates are unacceptable for Xpert Xpress SARS-CoV-2/FLU/RSV  testing. Fact Sheet for Patients: PinkCheek.be Fact Sheet for Healthcare Providers: GravelBags.it This test is not yet approved or cleared by the [place] FDA and  has been authorized for detection and/or diagnosis of SARS-CoV-2 by  FDA under an Emergency Use Authorization (EUA). This EUA will remain  in effect (meaning this test can be used) for the duration of the  Covid-19 declaration under Section 564(b)(1) of the Act, 21  U.S.C. section 360bbb-3(b)(1), unless the authorization is  terminated or revoked. Performed at [facility], [place] [address], [place], [place] [address]   MRSA PCR Screening     Status: Abnormal   Collection Time: 12/20/19  5:56 AM   Specimen: Nasopharyngeal  Result Value Ref Range Status   MRSA by PCR POSITIVE (A) NEGATIVE Final    Comment:        The GeneXpert MRSA Assay (FDA approved for NASAL specimens only), is one component  of a comprehensive MRSA  colonization surveillance program. It is not intended to diagnose MRSA infection nor to guide or monitor treatment for MRSA infections. RESULT CALLED TO, READ BACK BY AND VERIFIED WITH: ROBERTS,R RN 12/20/2019 AT [facility]      Radiology Studies: NM Pulmonary Perfusion  Result Date: 12/23/2019 CLINICAL DATA:  Concern for pulmonary embolism.  Positive D-dimer. EXAM: NUCLEAR MEDICINE PERFUSION LUNG SCAN TECHNIQUE: Perfusion images were obtained in multiple projections after intravenous injection of radiopharmaceutical. Ventilation scans intentionally deferred if perfusion scan and chest x-ray adequate for interpretation during COVID 19 epidemic. RADIOPHARMACEUTICALS:  1.55 mCi Tc-74m MAA IV COMPARISON:  Chest radiograph 12/23/2019 FINDINGS: No wedge-shaped peripheral perfusion defects within the LEFT or RIGHT lung to suggest acute pulmonary embolism. IMPRESSION: No evidence acute pulmonary embolism. Electronically Signed   By: Suzy Bouchard M.D.   On: 12/23/2019 17:14   Scheduled Meds: . amLODipine  5 mg Oral Daily  . atorvastatin  10 mg Oral Daily  . chlorhexidine  15 mL Mouth Rinse BID  . cholecalciferol  400 Units Oral Once per day on Mon Thu  . doxycycline  100 mg Oral Q12H  . ferrous sulfate  325 mg Oral Q breakfast  . heparin  5,000 Units Subcutaneous Q8H  . hydrocerin   Topical BID  . insulin aspart  0-20 Units Subcutaneous Q4H  . linaclotide  145 mcg Oral Daily  . loratadine  10 mg Oral Daily  . magnesium oxide  400 mg Oral Daily  . mouth rinse  15 mL Mouth Rinse q12n4p  . melatonin  3 mg Oral QHS  . pantoprazole  40 mg Oral QHS  . polyethylene glycol  17 g Oral Daily  . pregabalin  75 mg Oral TID   Continuous Infusions:   LOS: 5 days   Time spent: 45 minutes   Little Ishikawa, DO [facility]  If 7PM-7AM, please contact night-coverage [email or web address]  12/25/2019, 1:59 PM

## 2019-12-26 DIAGNOSIS — L089 Local infection of the skin and subcutaneous tissue, unspecified: Secondary | ICD-10-CM

## 2019-12-26 DIAGNOSIS — E11628 Type 2 diabetes mellitus with other skin complications: Secondary | ICD-10-CM

## 2019-12-26 DIAGNOSIS — G929 Unspecified toxic encephalopathy: Secondary | ICD-10-CM

## 2019-12-26 DIAGNOSIS — G92 Toxic encephalopathy: Secondary | ICD-10-CM

## 2019-12-26 HISTORY — DX: Type 2 diabetes mellitus with other skin complications: E11.628

## 2019-12-26 HISTORY — DX: Unspecified toxic encephalopathy: G92.9

## 2019-12-26 HISTORY — DX: Local infection of the skin and subcutaneous tissue, unspecified: L08.9

## 2019-12-26 LAB — GLUCOSE, CAPILLARY
Glucose-Capillary: 135 mg/dL — ABNORMAL HIGH (ref 70–99)
Glucose-Capillary: 220 mg/dL — ABNORMAL HIGH (ref 70–99)
Glucose-Capillary: 245 mg/dL — ABNORMAL HIGH (ref 70–99)

## 2019-12-26 LAB — RENAL FUNCTION PANEL
Albumin: 3 g/dL — ABNORMAL LOW (ref 3.5–5.0)
Anion gap: 8 (ref 5–15)
BUN: 37 mg/dL — ABNORMAL HIGH (ref 6–20)
CO2: 28 mmol/L (ref 22–32)
Calcium: 8.4 mg/dL — ABNORMAL LOW (ref 8.9–10.3)
Chloride: 107 mmol/L (ref 98–111)
Creatinine, Ser: 1.54 mg/dL — ABNORMAL HIGH (ref 0.44–1.00)
GFR calc Af Amer: 45 mL/min — ABNORMAL LOW (ref >60)
GFR calc non Af Amer: 39 mL/min — ABNORMAL LOW (ref >60)
Glucose, Bld: 148 mg/dL — ABNORMAL HIGH (ref 70–99)
Phosphorus: 3.4 mg/dL (ref 2.5–4.6)
Potassium: 4.1 mmol/L (ref 3.5–5.1)
Sodium: 143 mmol/L (ref 135–145)

## 2019-12-26 LAB — MAGNESIUM: Magnesium: 2.2 mg/dL (ref 1.7–2.4)

## 2019-12-26 MED ORDER — ZOLPIDEM TARTRATE 10 MG PO TABS: 5.0000 mg | ORAL_TABLET | Freq: Every evening | ORAL | 0 refills | Status: AC | PRN

## 2019-12-26 MED ORDER — DOXYCYCLINE HYCLATE 100 MG PO TABS: 100.0000 mg | ORAL_TABLET | Freq: Two times a day (BID) | ORAL | 0 refills | Status: AC

## 2019-12-26 NOTE — Progress Notes (Signed)
Inpatient Diabetes Program Recommendations  AACE/ADA: New Consensus Statement on Inpatient Glycemic Control (2015)  Target Ranges:  Prepandial:   less than 140 mg/dL      Peak postprandial:   less than 180 mg/dL (1-2 hours)      Critically ill patients:  140 - 180 mg/dL   Results for Emma Patton, Emma Patton (MRN [id number]) as of 12/26/2019 10:36  Ref. Range 12/24/2019 23:26 12/25/2019 04:03 12/25/2019 08:13 12/25/2019 12:00 12/25/2019 16:05 12/25/2019 20:01  Glucose-Capillary Latest Ref Range: 70 - 99 mg/dL 208 (H)  7 units NOVOLOG  186 (H)  4 units NOVOLOG  166 (H)  4 units NOVOLOG  182 (H)  4 units NOVOLOG  288 (H)  11 units NOVOLOG  304 (H)  15 units NOVOLOG    Results for Emma Patton, Emma Patton (MRN [id number]) as of 12/26/2019 10:36  Ref. Range 12/26/2019 00:25 12/26/2019 03:54 12/26/2019 08:37  Glucose-Capillary Latest Ref Range: 70 - 99 mg/dL 220 (H)  7 units NOVOLOG  135 (H)  3 units NOVOLOG  245 (H)  7 units NOVOLOG     Home DM Meds: Omni Pod Insulin Pump       Januvia 25 mg Daily  Current Orders: Novolog Resistant Correction Scale/ SSI (0-20 units) Q4 hours     MD- Note patient uses Omni Pod Insulin Pump at home.  No documentation of home insulin pump rates and pump currently at home.    Gets a certain amount of basal insulin on pump at home and likely needs some basal insulin here in hospital to help control CBGs.    Please consider:  1. Start Levemir 14 units Daily (0.1 units/kg)  Patient can resume her insulin pump at home once discharged  2. Change Novolog SSI to TID AC + HS (currently ordered Q4 hours)  3. Start Novolog Meal Coverage: Novolog 3 units TID with meals      --Will follow patient during hospitalization--  Wyn Quaker RN, MSN, CDE Diabetes Coordinator Inpatient Glycemic Control Team Team Pager: [phone] (8a-5p)

## 2019-12-26 NOTE — TOC Transition Note (Addendum)
Transition of Care [facility]) - CM/SW Discharge Note   Patient Details  Name: Emma Patton MRN: [id number] Date of Birth: December 30, 1970  Transition of Care [facility]) CM/SW Contact:  Zenon Mayo, RN Phone Number: 12/26/2019, 10:10 AM   Clinical Narrative:    Patient is from home with spouse, she has a walker, a cane, a bsc, and a shower chair at home.  She goes to the [employer].  She states she has no issues with getting her medications, but she will need assistance with transportation at dc, also awaiting pt eval.  NCM offered choice from medicare.  gov list for [facility] for COPD  And possible HHPT.  She states she has no preference.  NCM made referral to [facility] with [facility], she was able to take referral.  Soc will begin 24 to 48 hrs post dc.  Awaiting pt eval also ,will need orders. She will only need HHRN orders.   Final next level of care: [place] Barriers to Discharge: No Barriers Identified   Patient Goals and CMS Choice Patient states their goals for this hospitalization and ongoing recovery are:: to get better CMS Medicare.gov Compare Post Acute Care list provided to:: Patient Choice offered to / list presented to : Patient  Discharge Placement                       Discharge Plan and Services                  DME Agency: NA       HH Arranged: RN, PT, Disease Management [place] Agency: [place] Date [facility] Agency Contacted: 12/26/19 Time HH Agency Contacted: [phone] Representative spoke with at [place]: [place] Determinants of Health ([place]) Interventions     Readmission Risk Interventions No flowsheet data found.

## 2019-12-26 NOTE — Discharge Summary (Signed)
Physician Discharge Summary  Emma Patton VZC:[id number] DOB: 1971-04-13 DOA: 12/19/2019  PCP: Garnetta Buddy, NP  Admit date: 12/19/2019 Discharge date: 12/26/2019  Admitted From: Home Disposition: Home with home health nursing  Recommendations for Outpatient Follow-up:  1. Follow up with PCP in 1-2 weeks 2. Please obtain BMP/CBC in one week  Home Health: Nursing  Discharge Condition: Stable CODE STATUS: Full Diet recommendation: As tolerated  Brief/Interim Summary: 49 year old female with past medical history as below which is significant for chronic kidney disease and hypertension. She also takes chronic opioids for back pain with a history of osteomyelitis of the spine. In the late evening hours of 3/22 she was unable to be aroused by her husband. It is reported that she took opioid pain medications earlier in the night. EMS was called and administered Narcan to which she had a positive response.She was transported to the emergency department at [facility]. She was unable to give a good history at that time. Upon arrival to the emergency department she was noted to be hypoxemic requiring supplemental oxygen. Her oxygen saturation dropped as low as into the 60s at one point. Laboratory evaluation was significant for creatinine of 3.5, potassium of greater than 7.5, high-sensitivity troponin of 68, AST 179, ALT 211, WBC 17.6, and hemoglobin 10.7. CT of the head was unremarkable. Initial COVID-19 antigen testing was negative. Her mental status did improve after Narcan administration and with supplemental oxygen. Her hyperkalemia was treated with temporizing measures as well as a one-time dose of Lokelma. Given hyperkalemia and hypoxemia she was felt to be a candidate for admission to ICU and PCCM was consulted  Patient admitted as above with acute toxic encephalopathy in the setting of polypharmacy with acute hypoxic respiratory failure, acute renal failure requiring  CVVH, as well as worsening acute on chronic ambulatory dysfunction.  Patient's renal function resolved, no longer requiring CVVH or intervention, kidney function back to baseline, patient's hypoxia resolved with appropriate care including oxygen supplementation, nebs, supportive care, and early ambulation.  Patient is on multiple CNS depressant medications as such we did decrease her dose of Ambien, stop her gabapentin completely as well as discontinued trazodone and Zanaflex.  At this time patient's mental status is back to baseline, ambulating with some difficulty but appears to be near her baseline, no longer hypoxic.  Given patient's polypharmacy, acute mental status changes and concern for medication compliance as well as further evaluation and treatment of chronic conditions including COPD Home health nursing has been added to ensure safe disposition home and to decrease risk of further medication mistakes or incidental overdoses.  Patient did have notable wound to right lower extremity at her foot, was placed on doxycycline, will continue this at discharge until she can be followed up by PCP for further evaluation and treatment.  Discharge Diagnoses:  Principal Problem:   Toxic encephalopathy Active Problems:   Acute renal failure (ARF) (HCC)   Anemia   DM type 2, uncontrolled, with neuropathy ([place])   Polypharmacy   Acute hypoxemic respiratory failure ([place])   Diabetic foot infection [facility])   Discharge Instructions  Discharge Instructions    Call MD for:  difficulty breathing, headache or visual disturbances   Complete by: As directed    Call MD for:  extreme fatigue   Complete by: As directed    Call MD for:  hives   Complete by: As directed    Call MD for:  persistant dizziness or light-headedness   Complete by: As directed  Call MD for:  persistant nausea and vomiting   Complete by: As directed    Call MD for:  severe uncontrolled pain   Complete by: As directed    Call MD for:   temperature >100.4   Complete by: As directed    Diet - low sodium heart healthy   Complete by: As directed    Increase activity slowly   Complete by: As directed      Allergies as of 12/26/2019      Reactions   Penicillins Rash, Hives      Medication List    STOP taking these medications   gabapentin 100 MG capsule Commonly known as: NEURONTIN   gabapentin 400 MG capsule Commonly known as: NEURONTIN   tiZANidine 4 MG tablet Commonly known as: ZANAFLEX   traZODone 100 MG tablet Commonly known as: DESYREL     TAKE these medications   amLODipine 5 MG tablet Commonly known as: NORVASC Take 5 mg by mouth daily.   atorvastatin 10 MG tablet Commonly known as: LIPITOR Take 10 mg by mouth daily.   benazepril-hydrochlorthiazide 10-12.5 MG tablet Commonly known as: LOTENSIN HCT Take 1 tablet by mouth in the morning and at bedtime.   celecoxib 200 MG capsule Commonly known as: CELEBREX Take 200 mg by mouth 2 (two) times daily.   cetirizine 10 MG tablet Commonly known as: ZYRTEC Take 10 mg by mouth daily.   doxycycline 100 MG tablet Commonly known as: VIBRA-TABS Take 1 tablet (100 mg total) by mouth every 12 (twelve) hours for 5 days.   ferrous sulfate 325 (65 FE) MG tablet Take 325 mg by mouth daily with breakfast.   furosemide 40 MG tablet Commonly known as: LASIX Take 80 mg by mouth 2 (two) times daily.   HumaLOG 100 UNIT/ML injection Generic drug: insulin lispro Inject into the skin See admin instructions. For use in insulin pump   Linzess 145 MCG Caps capsule Generic drug: linaclotide Take 145 mcg by mouth daily.   Lyrica 75 MG capsule Generic drug: pregabalin Take 75 mg by mouth 3 (three) times daily.   magnesium oxide 400 MG tablet Commonly known as: MAG-OX Take 400 mg by mouth daily.   metoCLOPramide 10 MG tablet Commonly known as: REGLAN Take 10 mg by mouth See admin instructions. Take 62m by mouth before meals and at bedtime.    omeprazole 20 MG capsule Commonly known as: PRILOSEC Take 20 mg by mouth in the morning and at bedtime.   OmniPod Starter Kit by Does not apply route See admin instructions. For continuous Humalog insulin use   ondansetron 4 MG tablet Commonly known as: ZOFRAN Take 4 mg by mouth every 8 (eight) hours as needed for nausea or vomiting.   oxyCODONE-acetaminophen 10-325 MG tablet Commonly known as: PERCOCET Take 1 tablet by mouth every 6 (six) hours as needed for pain.   sitaGLIPtin 25 MG tablet Commonly known as: JANUVIA Take 25 mg by mouth daily.   VITAMIN D2 PO Take 1.25 mg by mouth 2 (two) times a week.   zolpidem 10 MG tablet Commonly known as: AMBIEN Take 0.5 tablets (5 mg total) by mouth at bedtime as needed for sleep. What changed: how much to take      Follow-up Information    [facility], EFredric Dine NP. Go on 01/04/2020.   Specialty: Family Medicine Why: @1 :50pm Contact information: [phone]       [facility], [facility] [place].   Why: HHRN, HHPT Contact information: [place] [address] [phone]          Allergies  Allergen Reactions  . Penicillins Rash and Hives    Consultations: PCCM, nephrology   Procedures/Studies: DG Chest 2 View  Result Date: 12/23/2019 CLINICAL DATA:  Shortness of breath and loss of consciousness EXAM: CHEST - 2 VIEW COMPARISON:  12/20/2019 FINDINGS: Cardiac shadow is mildly enlarged but stable. Right jugular catheter has been removed in the interval. Improved aeration is noted when compare with the prior exam although some mild residual vascular congestion remains. No new infiltrate or effusion is seen. No bony abnormality is noted. IMPRESSION: Improved aeration although some mild vascular congestion remains. Electronically Signed   By: Inez Catalina M.D.   On: 12/23/2019 13:21   CT Head Wo Contrast  Result Date: 12/20/2019 CLINICAL DATA:  Altered mental  status EXAM: CT HEAD WITHOUT CONTRAST TECHNIQUE: Contiguous axial images were obtained from the base of the skull through the vertex without intravenous contrast. COMPARISON:  None. FINDINGS: Brain: No evidence of acute territorial infarction, hemorrhage, hydrocephalus,extra-axial collection or mass lesion/mass effect. The ventricles are normal in size and contour. Low-attenuation changes in the deep white matter consistent with small vessel ischemia. Vascular: No hyperdense vessel or unexpected calcification. Skull: The skull is intact. No fracture or focal lesion identified. Sinuses/Orbits: The visualized paranasal sinuses and mastoid air cells are clear. The orbits and globes intact. Other: None IMPRESSION: No acute intracranial abnormality. Findings consistent with chronic small vessel ischemia Electronically Signed   By: Prudencio Pair M.D.   On: 12/20/2019 00:36   NM Pulmonary Perfusion  Result Date: 12/23/2019 CLINICAL DATA:  Concern for pulmonary embolism.  Positive D-dimer. EXAM: NUCLEAR MEDICINE PERFUSION LUNG SCAN TECHNIQUE: Perfusion images were obtained in multiple projections after intravenous injection of radiopharmaceutical. Ventilation scans intentionally deferred if perfusion scan and chest x-ray adequate for interpretation during COVID 19 epidemic. RADIOPHARMACEUTICALS:  1.55 mCi Tc-12mMAA IV COMPARISON:  Chest radiograph 12/23/2019 FINDINGS: No wedge-shaped peripheral perfusion defects within the LEFT or RIGHT lung to suggest acute pulmonary embolism. IMPRESSION: No evidence acute pulmonary embolism. Electronically Signed   By: SSuzy BouchardM.D.   On: 12/23/2019 17:14   [place]NAL  Result Date: 12/20/2019 CLINICAL DATA:  Acute kidney injury EXAM: RENAL / URINARY TRACT ULTRASOUND COMPLETE COMPARISON:  None. FINDINGS: Right Kidney: Renal measurements: 11.0 x 6.9 x 5.4 cm = volume: 211 mL . Echogenicity within normal limits. No mass or hydronephrosis visualized. Left Kidney: Renal  measurements: 11.2 x 6.3 x 6.2 cm = volume: 227 mL. Echogenicity within normal limits. No mass or hydronephrosis visualized. Bladder: Decompressed Other: None. IMPRESSION: Normal appearing kidneys.  Decompressed bladder Electronically Signed   By: BPrudencio PairM.D.   On: 12/20/2019 04:37   DG CHEST PORT 1 VIEW  Result Date: 12/20/2019 CLINICAL DATA:  49year old female with central line placement. EXAM: PORTABLE CHEST 1 VIEW COMPARISON:  Chest radiograph dated 12/19/2019. FINDINGS: Right IJ central venous line over central SVC. No pneumothorax. There is cardiomegaly with vascular congestion and probable mild edema, progressed since the prior radiograph. Pneumonia is not excluded. Clinical correlation is recommended. No large pleural effusion. No acute osseous pathology. IMPRESSION: 1. Interval placement of a right IJ central venous line with tip over central SVC. No pneumothorax. 2. Cardiomegaly with findings of CHF. Electronically Signed   By: AAnner CreteM.D.   On: 12/20/2019 19:20   DG Chest Portable 1 View  Result Date: 12/19/2019 CLINICAL DATA:  Altered mental status EXAM: PORTABLE CHEST 1 VIEW COMPARISON:  None. FINDINGS: Cardiomegaly, vascular congestion. Left perihilar airspace opacity. No visible effusions. No acute bony abnormality. IMPRESSION: Cardiomegaly with vascular congestion. Left perihilar airspace disease could reflect asymmetric edema or pneumonia. Electronically Signed   By: Rolm Baptise M.D.   On: 12/19/2019 21:57   DG Foot 2 Views Right  Result Date: 12/20/2019 CLINICAL DATA:  Chronic right foot wound EXAM: RIGHT FOOT - 2 VIEW COMPARISON:  None. FINDINGS: There is no evidence of fracture or dislocation. Soft tissue swelling and ulceration overlying the medial aspect of the foot at the level of the great toe proximal phalanx and first MTP joint. There is subtle cortical irregularity along the medial cortex of the great toe proximal phalanx. No well-defined erosive change. Mild  hallux valgus. Mild diffuse soft tissue swelling. Prominent vascular calcifications, advanced for age. IMPRESSION: Soft tissue swelling and ulceration overlying the medial aspect of the great toe. There is subtle cortical irregularity along the medial cortex of the great toe proximal phalanx without well-defined erosive change. Findings may reflect sequela of acute or chronic infection. If clinically indicated, MRI could be performed for further evaluation. Electronically Signed   By: Davina Poke D.O.   On: 12/20/2019 11:43   ECHOCARDIOGRAM COMPLETE  Result Date: 12/20/2019    ECHOCARDIOGRAM REPORT   Patient Name:   MADYLIN FAIRBANK Date of Exam: 12/20/2019 Medical Rec #:  [id number]            Height:       74.0 in Accession #:    [id number]           Weight:       320.5 lb Date of Birth:  1971-01-23            BSA:          2.656 m Patient Age:    48 years             BP:           105/57 mmHg Patient Gender: F                    HR:           79 bpm. Exam Location:  Inpatient Procedure: 2D Echo and Intracardiac Opacification Agent Indications:    Acute Respiratory Insufficiency 518.82 / R06.89  History:        Patient has no prior history of Echocardiogram examinations.                 Arrythmias:Atrial Fibrillation; Risk Factors:Diabetes and                 Hypertension. Acute kidney injury.  Sonographer:    Vikki Ports Turrentine Referring Phys: [id number] Silvestre Moment [facility]  Sonographer Comments: Suboptimal subcostal window and Technically difficult study due to poor echo windows. Image acquisition challenging due to patient body habitus. IMPRESSIONS  1. Left ventricular ejection fraction, by estimation, is 55 to 60%. The left ventricle has normal function. The left ventricle has no regional wall motion abnormalities. Left ventricular diastolic parameters were normal.  2. Right ventricular systolic function is normal. The right ventricular size is normal. Tricuspid regurgitation signal is inadequate for assessing  PA pressure.  3. The mitral valve is grossly normal. No evidence of mitral valve regurgitation. No evidence of mitral stenosis.  4. The aortic valve is tricuspid. Aortic valve regurgitation is not visualized. No aortic stenosis  is present. FINDINGS  Left Ventricle: Left ventricular ejection fraction, by estimation, is 55 to 60%. The left ventricle has normal function. The left ventricle has no regional wall motion abnormalities. Definity contrast agent was given IV to delineate the left ventricular  endocardial borders. The left ventricular internal cavity size was normal in size. There is no left ventricular hypertrophy. Left ventricular diastolic parameters were normal. Normal left ventricular filling pressure. Right Ventricle: The right ventricular size is normal. No increase in right ventricular wall thickness. Right ventricular systolic function is normal. Tricuspid regurgitation signal is inadequate for assessing PA pressure. Left Atrium: Left atrial size was normal in size. Right Atrium: Right atrial size was normal in size. Pericardium: Trivial pericardial effusion is present. The pericardial effusion is posterior to the left ventricle. Presence of pericardial fat pad. Mitral Valve: The mitral valve is grossly normal. Mild mitral annular calcification. No evidence of mitral valve regurgitation. No evidence of mitral valve stenosis. Tricuspid Valve: The tricuspid valve is grossly normal. Tricuspid valve regurgitation is trivial. No evidence of tricuspid stenosis. Aortic Valve: The aortic valve is tricuspid. Aortic valve regurgitation is not visualized. No aortic stenosis is present. Pulmonic Valve: The pulmonic valve was grossly normal. Pulmonic valve regurgitation is not visualized. No evidence of pulmonic stenosis. Aorta: The aortic root is normal in size and structure. Venous: IVC assessment for right atrial pressure unable to be performed due to mechanical ventilation. IAS/Shunts: No atrial level shunt  detected by color flow Doppler.  LEFT VENTRICLE PLAX 2D LVIDd:         5.80 cm  Diastology LVIDs:         3.80 cm  LV e' lateral:   10.90 cm/s LV PW:         1.00 cm  LV E/e' lateral: 10.4 LV IVS:        1.00 cm  LV e' medial:    5.93 cm/s LVOT diam:     2.40 cm  LV E/e' medial:  19.1 LV SV:         105 LV SV Index:   40 LVOT Area:     4.52 cm  RIGHT VENTRICLE RV S prime:     15.40 cm/s TAPSE (M-mode): 2.5 cm LEFT ATRIUM             Index       RIGHT ATRIUM           Index LA diam:        4.80 cm 1.81 cm/m  RA Area:     18.20 cm LA Vol (A2C):   81.1 ml 30.54 ml/m RA Volume:   47.10 ml  17.73 ml/m LA Vol (A4C):   74.3 ml 27.97 ml/m LA Biplane Vol: 80.5 ml 30.31 ml/m  AORTIC VALVE LVOT Vmax:   112.00 cm/s LVOT Vmean:  86.700 cm/s LVOT VTI:    0.233 m  AORTA Ao Root diam: 3.10 cm MITRAL VALVE MV Area (PHT): 2.99 cm     SHUNTS MV Decel Time: 254 msec     Systemic VTI:  0.23 m MV E velocity: 113.00 cm/s  Systemic Diam: 2.40 cm MV A velocity: 89.70 cm/s MV E/A ratio:  1.26 Eleonore Chiquito MD Electronically signed by Eleonore Chiquito MD Signature Date/Time: 12/20/2019/12:09:51 PM    Final    VAS [place] LOWER EXTREMITY VENOUS (DVT)  Result Date: 12/20/2019  Lower Venous DVTStudy Indications: Edema.  Limitations: Body habitus and poor ultrasound/tissue interface. Comparison Study: No prior exam. Performing Technologist: Marita [place]  Siebrecht ARDMS, RVT  Examination Guidelines: A complete evaluation includes B-mode imaging, spectral Doppler, color Doppler, and power Doppler as needed of all accessible portions of each vessel. Bilateral testing is considered an integral part of a complete examination. Limited examinations for reoccurring indications may be performed as noted. The reflux portion of the exam is performed with the patient in reverse Trendelenburg.  +---------+---------------+---------+-----------+----------+------------------+ RIGHT    CompressibilityPhasicitySpontaneityPropertiesThrombus Aging      +---------+---------------+---------+-----------+----------+------------------+ CFV      Full           Yes      Yes                                     +---------+---------------+---------+-----------+----------+------------------+ SFJ      Full                                                            +---------+---------------+---------+-----------+----------+------------------+ FV Prox  Full                                                            +---------+---------------+---------+-----------+----------+------------------+ FV Mid   Full                                         visualized with                                                          color              +---------+---------------+---------+-----------+----------+------------------+ FV DistalFull                                         visualized with                                                          color              +---------+---------------+---------+-----------+----------+------------------+ PFV      Full                                                            +---------+---------------+---------+-----------+----------+------------------+ POP      Full           Yes      Yes                                     +---------+---------------+---------+-----------+----------+------------------+  PTV      Full                                         visualized with                                                          color              +---------+---------------+---------+-----------+----------+------------------+ PERO     Full                                         visualized with                                                          color              +---------+---------------+---------+-----------+----------+------------------+   +---------+---------------+---------+-----------+----------+------------------+ LEFT      CompressibilityPhasicitySpontaneityPropertiesThrombus Aging     +---------+---------------+---------+-----------+----------+------------------+ CFV      Full           Yes      Yes                                     +---------+---------------+---------+-----------+----------+------------------+ SFJ      Full                                                            +---------+---------------+---------+-----------+----------+------------------+ FV Prox  Full                                                            +---------+---------------+---------+-----------+----------+------------------+ FV Mid   Full                                                            +---------+---------------+---------+-----------+----------+------------------+ FV DistalFull                                         visualized with  color              +---------+---------------+---------+-----------+----------+------------------+ PFV      Full                                                            +---------+---------------+---------+-----------+----------+------------------+ POP      Full           Yes      Yes                                     +---------+---------------+---------+-----------+----------+------------------+ PTV      Full                                         visualized with                                                          color              +---------+---------------+---------+-----------+----------+------------------+ PERO     Full                                         visualized with                                                          color              +---------+---------------+---------+-----------+----------+------------------+    Summary: RIGHT: - There is no evidence of deep vein thrombosis in the lower extremity. However, portions of this  examination were limited- see technologist comments above.  - No cystic structure found in the popliteal fossa.  LEFT: - There is no evidence of deep vein thrombosis in the lower extremity. However, portions of this examination were limited- see technologist comments above.  - No cystic structure found in the popliteal fossa.  *See table(s) above for measurements and observations. Electronically signed by Deitra Mayo MD on 12/20/2019 at 1:06:02 PM.    Final     Subjective: No acute issues or events overnight, patient feels markedly improved, ambulating without much difficulty from baseline, no longer hypoxic, denies chest pain, shortness of breath, nausea, vomiting, diarrhea, constipation, headache, fevers, chills.   Discharge Exam: Vitals:   12/25/19 2136 12/26/19 0357  BP: 118/81 (!) 147/59  Pulse: 69 62  Resp: 17 20  Temp: 97.7 F (36.5 C) (!) 97.4 F (36.3 C)  SpO2: 100% 95%   Vitals:   12/25/19 2000 12/25/19 2136 12/26/19 0100 12/26/19 0357  BP: (!) 167/77 118/81  (!) 147/59  Pulse: 66 69  62  Resp: 20 17  20   Temp: 97.8 F (36.6 C) 97.7 F (  36.5 C)  (!) 97.4 F (36.3 C)  TempSrc: Oral Oral  Oral  SpO2: 95% 100%  95%  Weight:   (!) 142.4 kg   Height:        General:  Pleasantly resting in bed, No acute distress. HEENT:  Normocephalic atraumatic.  Sclerae nonicteric, noninjected.  Extraocular movements intact bilaterally. Neck:  Without mass or deformity.  Trachea is midline. Lungs: Diminished breath sounds bilaterally without overt rhonchi rales or wheeze Heart:  Regular rate and rhythm.  Without murmurs, rubs, or gallops. Abdomen:  Soft, nontender, nondistended.  Without guarding or rebound. Extremities: Without cyanosis, clubbing, edema, or obvious deformity. Vascular:  Dorsalis pedis and posterior tibial pulses palpable bilaterally. Skin:  Warm and dry, right foot wound at metatarsal level   The results of significant diagnostics from this hospitalization  (including imaging, microbiology, ancillary and laboratory) are listed below for reference.     Microbiology: Recent Results (from the past 240 hour(s))  SARS CORONAVIRUS 2 (TAT 6-24 HRS) Nasopharyngeal Nasopharyngeal Swab     Status: None   Collection Time: 12/19/19 11:29 PM   Specimen: Nasopharyngeal Swab  Result Value Ref Range Status   SARS Coronavirus 2 NEGATIVE NEGATIVE Final    Comment: (NOTE) SARS-CoV-2 target nucleic acids are NOT DETECTED. The SARS-CoV-2 RNA is generally detectable in upper and lower respiratory specimens during the acute phase of infection. Negative results do not preclude SARS-CoV-2 infection, do not rule out co-infections with other pathogens, and should not be used as the sole basis for treatment or other patient management decisions. Negative results must be combined with clinical observations, patient history, and epidemiological information. The expected result is Negative. Fact Sheet for Patients: SugarRoll.be Fact Sheet for Healthcare Providers: https://www.woods-mathews.com/ This test is not yet approved or cleared by the [place] FDA and  has been authorized for detection and/or diagnosis of SARS-CoV-2 by FDA under an Emergency Use Authorization (EUA). This EUA will remain  in effect (meaning this test can be used) for the duration of the COVID-19 declaration under Section 56 4(b)(1) of the Act, 21 U.S.C. section 360bbb-3(b)(1), unless the authorization is terminated or revoked sooner. Performed at [facility], [place] [address] [place], [place] [address]   Respiratory Panel by RT PCR (Flu A&B, Covid) - Nasopharyngeal Swab     Status: None   Collection Time: 12/19/19 11:29 PM   Specimen: Nasopharyngeal Swab  Result Value Ref Range Status   SARS Coronavirus 2 by RT PCR NEGATIVE NEGATIVE Final    Comment: (NOTE) SARS-CoV-2 target nucleic acids are NOT DETECTED. The SARS-CoV-2 RNA is  generally detectable in upper respiratoy specimens during the acute phase of infection. The lowest concentration of SARS-CoV-2 viral copies this assay can detect is 131 copies/mL. A negative result does not preclude SARS-Cov-2 infection and should not be used as the sole basis for treatment or other patient management decisions. A negative result may occur with  improper specimen collection/handling, submission of specimen other than nasopharyngeal swab, presence of viral mutation(s) within the areas targeted by this assay, and inadequate number of viral copies (<131 copies/mL). A negative result must be combined with clinical observations, patient history, and epidemiological information. The expected result is Negative. Fact Sheet for Patients:  PinkCheek.be Fact Sheet for Healthcare Providers:  GravelBags.it This test is not yet ap proved or cleared by the [place] FDA and  has been authorized for detection and/or diagnosis of SARS-CoV-2 by FDA under an Emergency Use Authorization (EUA). This EUA will  remain  in effect (meaning this test can be used) for the duration of the COVID-19 declaration under Section 564(b)(1) of the Act, 21 U.S.C. section 360bbb-3(b)(1), unless the authorization is terminated or revoked sooner.    Influenza A by PCR NEGATIVE NEGATIVE Final   Influenza B by PCR NEGATIVE NEGATIVE Final    Comment: (NOTE) The Xpert Xpress SARS-CoV-2/FLU/RSV assay is intended as an aid in  the diagnosis of influenza from Nasopharyngeal swab specimens and  should not be used as a sole basis for treatment. Nasal washings and  aspirates are unacceptable for Xpert Xpress SARS-CoV-2/FLU/RSV  testing. Fact Sheet for Patients: PinkCheek.be Fact Sheet for Healthcare Providers: GravelBags.it This test is not yet approved or cleared by the [place] FDA and   has been authorized for detection and/or diagnosis of SARS-CoV-2 by  FDA under an Emergency Use Authorization (EUA). This EUA will remain  in effect (meaning this test can be used) for the duration of the  Covid-19 declaration under Section 564(b)(1) of the Act, 21  U.S.C. section 360bbb-3(b)(1), unless the authorization is  terminated or revoked. Performed at [facility], [place] [address], [place], [place] [address]   MRSA PCR Screening     Status: Abnormal   Collection Time: 12/20/19  5:56 AM   Specimen: Nasopharyngeal  Result Value Ref Range Status   MRSA by PCR POSITIVE (A) NEGATIVE Final    Comment:        The GeneXpert MRSA Assay (FDA approved for NASAL specimens only), is one component of a comprehensive MRSA colonization surveillance program. It is not intended to diagnose MRSA infection nor to guide or monitor treatment for MRSA infections. RESULT CALLED TO, READ BACK BY AND VERIFIED WITH: ROBERTS,R RN 12/20/2019 AT 0706 SKEEN,P      Labs: BNP (last 3 results) Recent Labs    12/19/19 2125  BNP 604.5*   Basic Metabolic Panel: Recent Labs  Lab 12/22/19 0352 12/23/19 0449 12/24/19 0510 12/25/19 0731 12/26/19 0521  NA 135 140 139 141 143  K 3.8 4.4 4.2 4.4 4.1  CL 100 103 102 106 107  CO2 26 26 27 27 28   GLUCOSE 193* 238* 147* 180* 148*  BUN 44* 41* 40* 39* 37*  CREATININE 2.09* 1.80* 1.68* 1.51* 1.54*  CALCIUM 7.4* 8.2* 8.4* 8.6* 8.4*  MG 2.0 2.3 2.4 2.4 2.2  PHOS 2.9 3.7 3.7 3.9 3.4   Liver Function Tests: Recent Labs  Lab 12/19/19 2125 12/19/19 2125 12/20/19 0400 12/20/19 0400 12/20/19 1258 12/21/19 0347 12/22/19 0352 12/23/19 0449 12/24/19 0510 12/25/19 0731 12/26/19 0521  AST 179*  --  227*  --  122*  --   --   --   --   --   --   ALT 211*  --  284*  --  211*  --   --   --   --   --   --   ALKPHOS 171*  --  173*  --  133*  --   --   --   --   --   --   BILITOT 1.0  --  0.8  --  0.6  --   --   --   --   --   --   PROT 6.9  --   7.4  --  6.3*  --   --   --   --   --   --   ALBUMIN 3.2*   < > 3.5   < >  2.9*   < > 2.8* 3.1* 3.1* 3.0* 3.0*   < > = values in this interval not displayed.   No results for input(s): LIPASE, AMYLASE in the last 168 hours. No results for input(s): AMMONIA in the last 168 hours. CBC: Recent Labs  Lab 12/19/19 2125 12/20/19 0823 12/21/19 0347 12/22/19 0352 12/23/19 0449 12/24/19 0510 12/25/19 0731  WBC 17.6*  --  9.9 8.6 9.7 10.6* 11.2*  NEUTROABS 15.7*  --   --   --   --   --   --   HGB 10.7*   < > 9.7* 9.3* 10.3* 10.5* 10.5*  HCT 35.7*   < > 31.6* 29.5* 32.3* 33.1* 33.2*  MCV 98.9  --  99.7 96.1 93.4 94.0 94.1  PLT 292  --  245 229 255 249 230   < > = values in this interval not displayed.   Cardiac Enzymes: Recent Labs  Lab 12/20/19 1645  CKTOTAL 2,234*   BNP: Invalid input(s): POCBNP CBG: Recent Labs  Lab 12/25/19 1605 12/25/19 2001 12/26/19 0025 12/26/19 0354 12/26/19 0837  GLUCAP 288* 304* 220* 135* 245*   D-Dimer No results for input(s): DDIMER in the last 72 hours. Hgb A1c No results for input(s): HGBA1C in the last 72 hours. Lipid Profile No results for input(s): CHOL, HDL, LDLCALC, TRIG, CHOLHDL, LDLDIRECT in the last 72 hours. Thyroid function studies No results for input(s): TSH, T4TOTAL, T3FREE, THYROIDAB in the last 72 hours.  Invalid input(s): FREET3 Anemia work up No results for input(s): VITAMINB12, FOLATE, FERRITIN, TIBC, IRON, RETICCTPCT in the last 72 hours. Urinalysis    Component Value Date/Time   COLORURINE YELLOW 12/19/2019 2310   APPEARANCEUR HAZY (A) 12/19/2019 2310   LABSPEC 1.016 12/19/2019 2310   PHURINE 5.0 12/19/2019 2310   GLUCOSEU 50 (A) 12/19/2019 2310   HGBUR NEGATIVE 12/19/2019 2310   BILIRUBINUR NEGATIVE 12/19/2019 2310   KETONESUR NEGATIVE 12/19/2019 2310   PROTEINUR 30 (A) 12/19/2019 2310   NITRITE NEGATIVE 12/19/2019 2310   LEUKOCYTESUR NEGATIVE 12/19/2019 2310   Sepsis Labs Invalid input(s): PROCALCITONIN,   WBC,  LACTICIDVEN Microbiology Recent Results (from the past 240 hour(s))  SARS CORONAVIRUS 2 (TAT 6-24 HRS) Nasopharyngeal Nasopharyngeal Swab     Status: None   Collection Time: 12/19/19 11:29 PM   Specimen: Nasopharyngeal Swab  Result Value Ref Range Status   SARS Coronavirus 2 NEGATIVE NEGATIVE Final    Comment: (NOTE) SARS-CoV-2 target nucleic acids are NOT DETECTED. The SARS-CoV-2 RNA is generally detectable in upper and lower respiratory specimens during the acute phase of infection. Negative results do not preclude SARS-CoV-2 infection, do not rule out co-infections with other pathogens, and should not be used as the sole basis for treatment or other patient management decisions. Negative results must be combined with clinical observations, patient history, and epidemiological information. The expected result is Negative. Fact Sheet for Patients: SugarRoll.be Fact Sheet for Healthcare Providers: https://www.woods-mathews.com/ This test is not yet approved or cleared by the [place] FDA and  has been authorized for detection and/or diagnosis of SARS-CoV-2 by FDA under an Emergency Use Authorization (EUA). This EUA will remain  in effect (meaning this test can be used) for the duration of the COVID-19 declaration under Section 56 4(b)(1) of the Act, 21 U.S.C. section 360bbb-3(b)(1), unless the authorization is terminated or revoked sooner. Performed at [facility], [place] [address], [place], [place] [address]   Respiratory Panel by RT PCR (Flu A&B, Covid) - Nasopharyngeal Swab  Status: None   Collection Time: 12/19/19 11:29 PM   Specimen: Nasopharyngeal Swab  Result Value Ref Range Status   SARS Coronavirus 2 by RT PCR NEGATIVE NEGATIVE Final    Comment: (NOTE) SARS-CoV-2 target nucleic acids are NOT DETECTED. The SARS-CoV-2 RNA is generally detectable in upper respiratoy specimens during the acute phase of infection.  The lowest concentration of SARS-CoV-2 viral copies this assay can detect is 131 copies/mL. A negative result does not preclude SARS-Cov-2 infection and should not be used as the sole basis for treatment or other patient management decisions. A negative result may occur with  improper specimen collection/handling, submission of specimen other than nasopharyngeal swab, presence of viral mutation(s) within the areas targeted by this assay, and inadequate number of viral copies (<131 copies/mL). A negative result must be combined with clinical observations, patient history, and epidemiological information. The expected result is Negative. Fact Sheet for Patients:  PinkCheek.be Fact Sheet for Healthcare Providers:  GravelBags.it This test is not yet ap proved or cleared by the [place] FDA and  has been authorized for detection and/or diagnosis of SARS-CoV-2 by FDA under an Emergency Use Authorization (EUA). This EUA will remain  in effect (meaning this test can be used) for the duration of the COVID-19 declaration under Section 564(b)(1) of the Act, 21 U.S.C. section 360bbb-3(b)(1), unless the authorization is terminated or revoked sooner.    Influenza A by PCR NEGATIVE NEGATIVE Final   Influenza B by PCR NEGATIVE NEGATIVE Final    Comment: (NOTE) The Xpert Xpress SARS-CoV-2/FLU/RSV assay is intended as an aid in  the diagnosis of influenza from Nasopharyngeal swab specimens and  should not be used as a sole basis for treatment. Nasal washings and  aspirates are unacceptable for Xpert Xpress SARS-CoV-2/FLU/RSV  testing. Fact Sheet for Patients: PinkCheek.be Fact Sheet for Healthcare Providers: GravelBags.it This test is not yet approved or cleared by the [place] FDA and  has been authorized for detection and/or diagnosis of SARS-CoV-2 by  FDA under an  Emergency Use Authorization (EUA). This EUA will remain  in effect (meaning this test can be used) for the duration of the  Covid-19 declaration under Section 564(b)(1) of the Act, 21  U.S.C. section 360bbb-3(b)(1), unless the authorization is  terminated or revoked. Performed at [facility], [place] [address] [place], [place] [address]   MRSA PCR Screening     Status: Abnormal   Collection Time: 12/20/19  5:56 AM   Specimen: Nasopharyngeal  Result Value Ref Range Status   MRSA by PCR POSITIVE (A) NEGATIVE Final    Comment:        The GeneXpert MRSA Assay (FDA approved for NASAL specimens only), is one component of a comprehensive MRSA colonization surveillance program. It is not intended to diagnose MRSA infection nor to guide or monitor treatment for MRSA infections. RESULT CALLED TO, READ BACK BY AND VERIFIED WITH: ROBERTS,R RN 12/20/2019 AT 0706 SKEEN,P      Time coordinating discharge: Over 30 minutes  SIGNED:   Little Ishikawa, DO [facility] 12/26/2019, 10:52 AM Pager   If 7PM-7AM, please contact night-coverage [email or web address]

## 2019-12-26 NOTE — Care Management Important Message (Signed)
Important Message  Patient Details  Name: Quanta Robertshaw MRN: [id number] Date of Birth: 1971/03/04   Medicare Important Message Given:  Yes     Shelda Altes 12/26/2019, 9:52 AM

## 2019-12-26 NOTE — TOC Transition Note (Signed)
Transition of Care [facility]) - CM/SW Discharge Note   Patient Details  Name: Emma Patton MRN: [id number] Date of Birth: 04-Jul-1971  [facility] CM/SW Contact:  Zenon Mayo, RN Phone Number: 12/26/2019, 11:08 AM   Clinical Narrative:    Patient is from home with spouse, she has a walker, a cane, a bsc, and a shower chair at home.  She goes to the [employer].  She states she has no issues with getting her medications, but she will need assistance with transportation at dc, also awaiting pt eval.  NCM offered choice from medicare.  gov list for [facility] for COPD  And possible HHPT.  She states she has no preference.  NCM made referral to [facility] with [facility], she was able to take referral.  Soc will begin 24 to 48 hrs post dc.  Awaiting pt eval also ,will need orders. She will only need HHRN orders.    Final next level of care: [place] Barriers to Discharge: No Barriers Identified   Patient Goals and CMS Choice Patient states their goals for this hospitalization and ongoing recovery are:: to get better CMS Medicare.gov Compare Post Acute Care list provided to:: Patient Choice offered to / list presented to : Patient  Discharge Placement                       Discharge Plan and Services                  DME Agency: NA       HH Arranged: RN, Disease Management [place] Agency: [place] Date [place]: 12/26/19 Time HH Agency Contacted: [phone] Representative spoke with at [place]: [place] Determinants of Health ([place]) Interventions     Readmission Risk Interventions No flowsheet data found.

## 2019-12-26 NOTE — Evaluation (Signed)
Occupational Therapy Evaluation Patient Details Name: Emma Patton MRN: [id number] DOB: 1970-10-28 Today's Date: 12/26/2019    History of Present Illness Pt is a 49 yo female s/p Acute toxic encephalopathy in the setting of polypharmacy/accidental narcotic overdose with concurrent acute hypoxemic respiratory failure.PMHx: osteomyelitis of spine, HTN, DM, chronic back pain.   Clinical Impression   Pt PTA: Pt independent with ADL other than LB dressing- pt has assist from spouse. Pt drives and mobilizes well with RW. Pt currently ambulating 150' with RW with supervisionA; pt supervisionA for standing for ADL at sink and at commode. Pt reported no pain and no SOB. VSS on RA. No further OT skilled services required as pt back to her functional baseline. OT signing off.       Follow Up Recommendations  No OT follow up    Equipment Recommendations  None recommended by OT    Recommendations for Other Services       Precautions / Restrictions Precautions Precautions: None Restrictions Weight Bearing Restrictions: No      Mobility Bed Mobility Overal bed mobility: Modified Independent                Transfers Overall transfer level: Modified independent Equipment used: Rolling walker (2 wheeled)                  Balance Overall balance assessment: No apparent balance deficits (not formally assessed)                                         ADL either performed or assessed with clinical judgement   ADL Overall ADL's : At baseline                                       General ADL Comments: Pt performing ADL at functional baseline- supervisionA for grooming in standing; pericare/toileting at commode and  mobility with RW in hallway 150'     Vision Baseline Vision/History: Wears glasses Wears Glasses: At all times Patient Visual Report: No change from baseline Vision Assessment?: No apparent visual deficits      Perception     Praxis      Pertinent Vitals/Pain Pain Location: low back     Hand Dominance Right   Extremity/Trunk Assessment Upper Extremity Assessment Upper Extremity Assessment: Overall WFL for tasks assessed   Lower Extremity Assessment Lower Extremity Assessment: Overall WFL for tasks assessed   Cervical / Trunk Assessment Cervical / Trunk Assessment: Other exceptions Cervical / Trunk Exceptions: large body habitus   Communication Communication Communication: No difficulties   Cognition Arousal/Alertness: Awake/alert Behavior During Therapy: WFL for tasks assessed/performed Overall Cognitive Status: Within Functional Limits for tasks assessed                                     General Comments  VSS on RA    Exercises     Shoulder Instructions      Home Living Family/patient expects to be discharged to:: Private residence Living Arrangements: Spouse/significant other Available Help at Discharge: Family;Available 24 hours/day Type of Home: Mobile home Home Access: Stairs to enter Entrance Stairs-Number of Steps: 3 Entrance Stairs-Rails: Right Home Layout: One level     Bathroom Shower/Tub: Walk-in shower  Bathroom Toilet: Standard     Home Equipment: Environmental consultant - 2 wheels;Bedside commode;Shower seat          Prior Functioning/Environment Level of Independence: Needs assistance  Gait / Transfers Assistance Needed: Walks with RW; sometimes needs assist from spouse for bed mobility ADL's / Homemaking Assistance Needed:  Pt has LB ADL AE, but prefers her spouse to assist. Pt drives, grocery shops            OT Problem List:        OT Treatment/Interventions:      OT Goals(Current goals can be found in the care plan section)    OT Frequency:     Barriers to D/C:            Co-evaluation              AM-PAC OT "6 Clicks" Daily Activity     Outcome Measure Help from another person eating meals?: None Help from  another person taking care of personal grooming?: None Help from another person toileting, which includes using toliet, bedpan, or urinal?: None Help from another person bathing (including washing, rinsing, drying)?: A Little Help from another person to put on and taking off regular upper body clothing?: None Help from another person to put on and taking off regular lower body clothing?: A Little 6 Click Score: 22   End of Session Equipment Utilized During Treatment: Rolling walker Nurse Communication: Mobility status  Activity Tolerance: Patient tolerated treatment well Patient left: in chair;with call bell/phone within reach  OT Visit Diagnosis: Unsteadiness on feet (R26.81)                Time: 7915-0569 OT Time Calculation (min): 19 min Charges:  OT General Charges $OT Visit: 1 Visit OT Evaluation $OT Eval Moderate Complexity: 1 Mod  Jefferey Pica, OTR/L Acute Rehabilitation Services Pager: [phone] Office: [phone]   Quanisha Drewry C 12/26/2019, 5:09 PM

## 2020-10-09 IMAGING — DX DG CHEST 2V
2 series · 2 of 2 positions shown · non-contrast
Comparison: 12/20/2019

CLINICAL DATA: Shortness of breath and loss of consciousness

EXAM:
CHEST - 2 VIEW

[x chest ap]
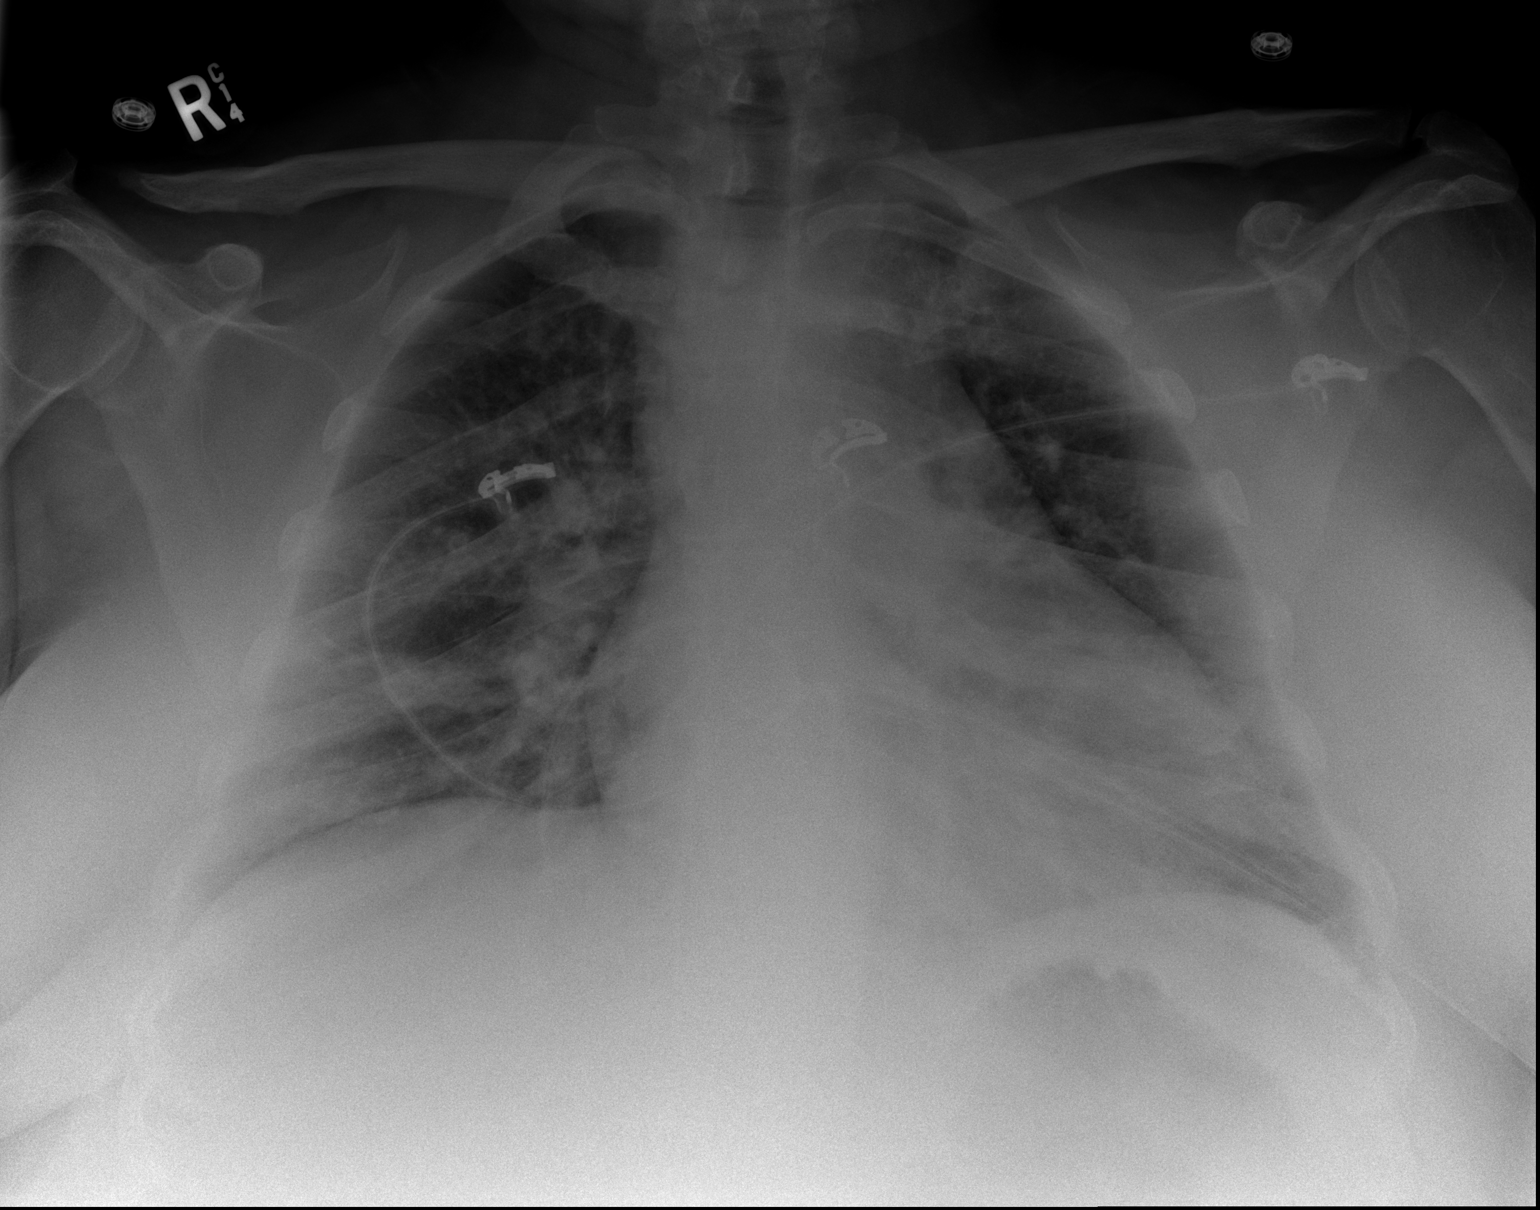

[w chest lat]
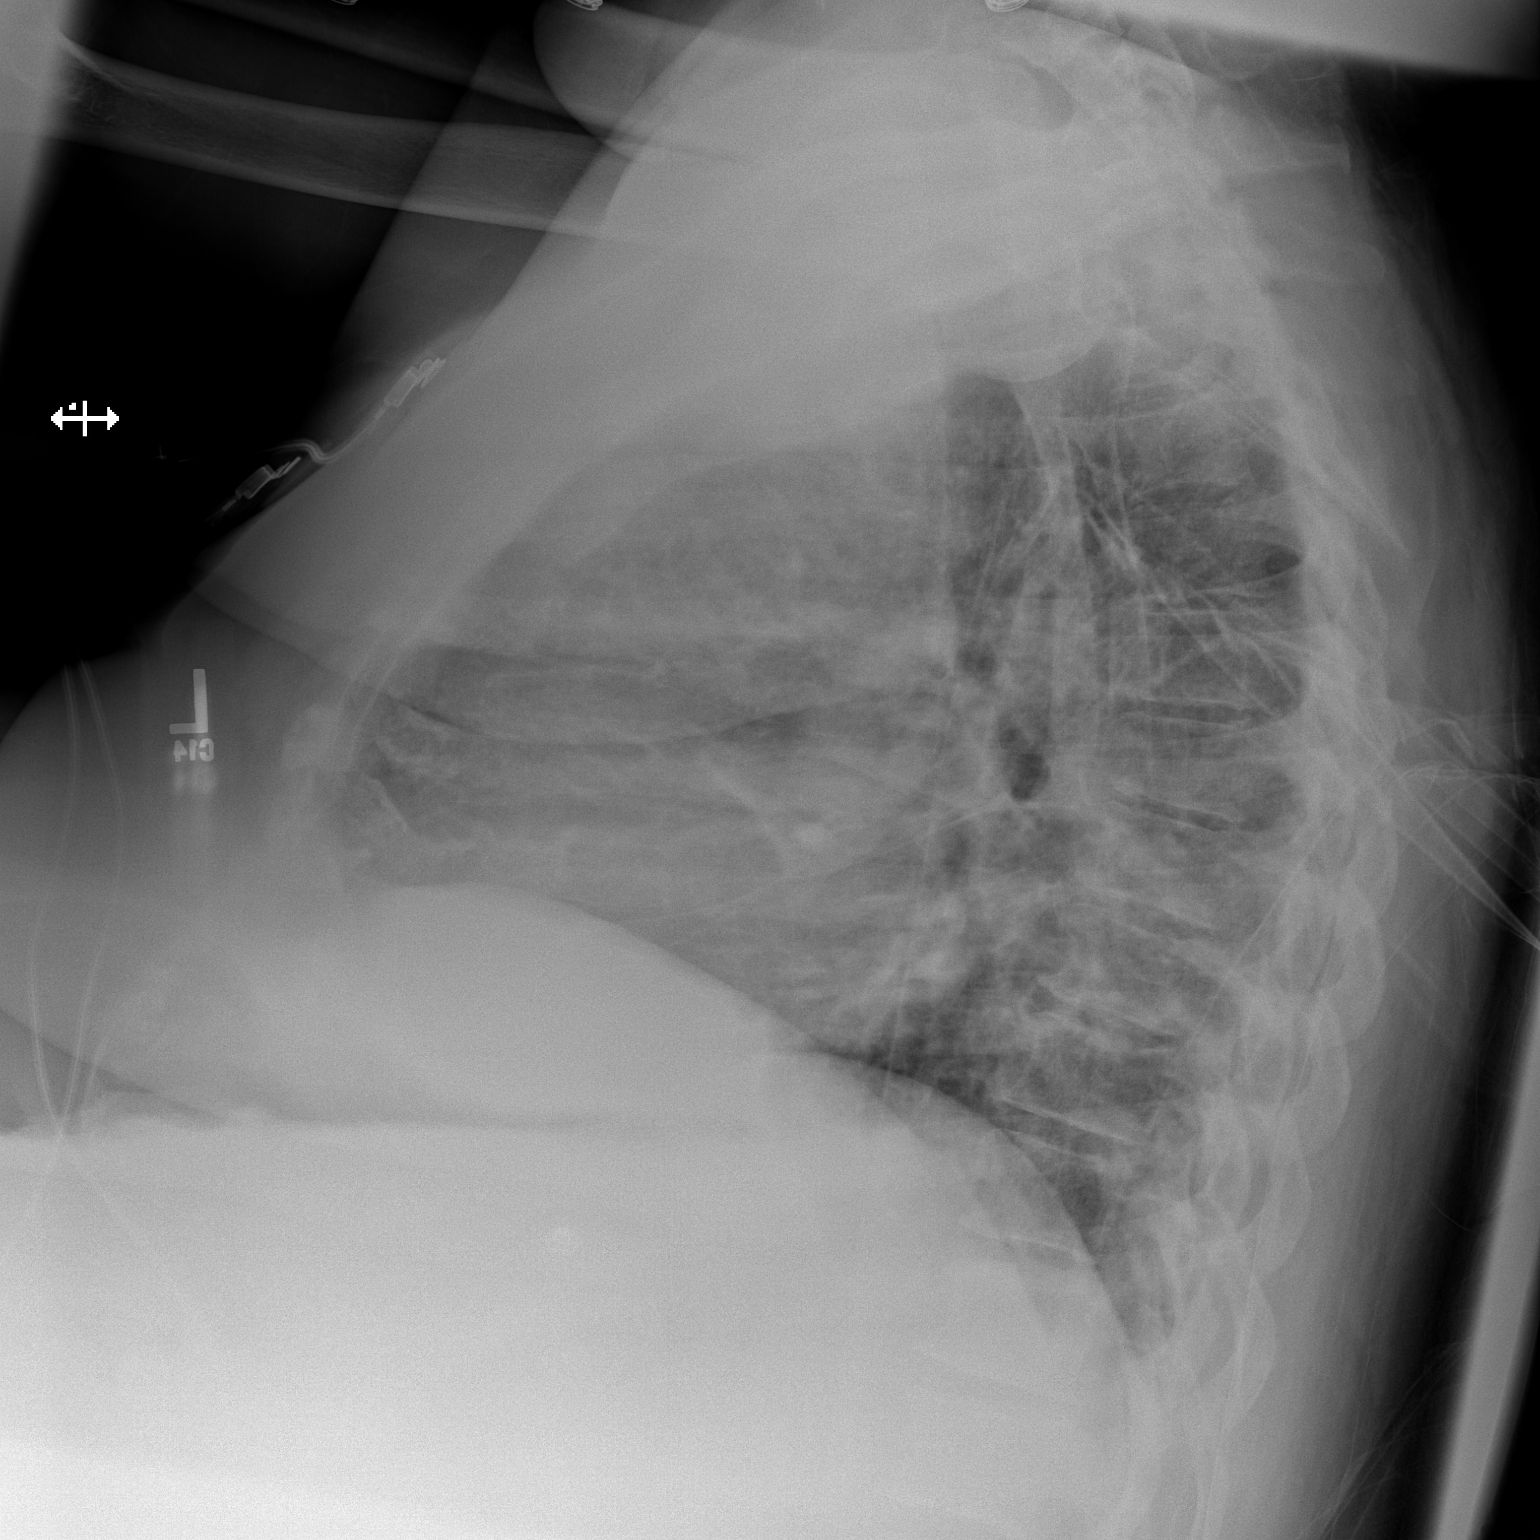

[2 of 2 positions shown; findings below may reference images not displayed]

FINDINGS: Cardiac shadow is mildly enlarged but stable. Right jugular catheter
has been removed in the interval. Improved aeration is noted when
compare with the prior exam although some mild residual vascular
congestion remains. No new infiltrate or effusion is seen. No bony
abnormality is noted.
IMPRESSION: Improved aeration although some mild vascular congestion remains.

## 2021-02-13 ENCOUNTER — Inpatient Hospital Stay
Admission: AD | Admit: 2021-02-13 | Payer: Medicare Other | Source: Other Acute Inpatient Hospital | Admitting: Family Medicine

## 2021-02-14 ENCOUNTER — Encounter (HOSPITAL_COMMUNITY): Payer: Self-pay

## 2021-02-14 ENCOUNTER — Other Ambulatory Visit: Payer: Self-pay

## 2021-02-14 ENCOUNTER — Inpatient Hospital Stay (HOSPITAL_COMMUNITY)
Admission: EM | Admit: 2021-02-14 | Discharge: 2021-02-20 | DRG: 683 | Disposition: A | Discharge status: Home / Self Care | Payer: Medicare Other | Source: Other Acute Inpatient Hospital | Attending: Internal Medicine | Admitting: Internal Medicine

## 2021-02-14 ENCOUNTER — Emergency Department (HOSPITAL_COMMUNITY): Payer: Medicare Other

## 2021-02-14 DIAGNOSIS — N179 Acute kidney failure, unspecified: Secondary | ICD-10-CM | POA: Diagnosis present

## 2021-02-14 DIAGNOSIS — N189 Chronic kidney disease, unspecified: Secondary | ICD-10-CM | POA: Diagnosis present

## 2021-02-14 DIAGNOSIS — E871 Hypo-osmolality and hyponatremia: Secondary | ICD-10-CM | POA: Diagnosis present

## 2021-02-14 DIAGNOSIS — L97519 Non-pressure chronic ulcer of other part of right foot with unspecified severity: Secondary | ICD-10-CM | POA: Diagnosis present

## 2021-02-14 DIAGNOSIS — Z20822 Contact with and (suspected) exposure to covid-19: Secondary | ICD-10-CM | POA: Diagnosis present

## 2021-02-14 DIAGNOSIS — E785 Hyperlipidemia, unspecified: Secondary | ICD-10-CM | POA: Diagnosis present

## 2021-02-14 DIAGNOSIS — E1122 Type 2 diabetes mellitus with diabetic chronic kidney disease: Secondary | ICD-10-CM | POA: Diagnosis present

## 2021-02-14 DIAGNOSIS — T501X5A Adverse effect of loop [high-ceiling] diuretics, initial encounter: Secondary | ICD-10-CM | POA: Diagnosis present

## 2021-02-14 DIAGNOSIS — E875 Hyperkalemia: Secondary | ICD-10-CM | POA: Diagnosis present

## 2021-02-14 DIAGNOSIS — E1169 Type 2 diabetes mellitus with other specified complication: Secondary | ICD-10-CM | POA: Diagnosis present

## 2021-02-14 DIAGNOSIS — E559 Vitamin D deficiency, unspecified: Secondary | ICD-10-CM | POA: Diagnosis present

## 2021-02-14 DIAGNOSIS — E1165 Type 2 diabetes mellitus with hyperglycemia: Secondary | ICD-10-CM | POA: Diagnosis present

## 2021-02-14 DIAGNOSIS — E1140 Type 2 diabetes mellitus with diabetic neuropathy, unspecified: Secondary | ICD-10-CM | POA: Diagnosis present

## 2021-02-14 DIAGNOSIS — T368X5A Adverse effect of other systemic antibiotics, initial encounter: Secondary | ICD-10-CM | POA: Diagnosis present

## 2021-02-14 DIAGNOSIS — E11621 Type 2 diabetes mellitus with foot ulcer: Secondary | ICD-10-CM | POA: Diagnosis present

## 2021-02-14 DIAGNOSIS — I9589 Other hypotension: Secondary | ICD-10-CM | POA: Diagnosis present

## 2021-02-14 DIAGNOSIS — G894 Chronic pain syndrome: Secondary | ICD-10-CM | POA: Diagnosis present

## 2021-02-14 DIAGNOSIS — IMO0002 Reserved for concepts with insufficient information to code with codable children: Secondary | ICD-10-CM

## 2021-02-14 DIAGNOSIS — Z6841 Body Mass Index (BMI) 40.0 and over, adult: Secondary | ICD-10-CM | POA: Diagnosis not present

## 2021-02-14 DIAGNOSIS — D539 Nutritional anemia, unspecified: Secondary | ICD-10-CM | POA: Diagnosis present

## 2021-02-14 DIAGNOSIS — Z452 Encounter for adjustment and management of vascular access device: Secondary | ICD-10-CM | POA: Diagnosis not present

## 2021-02-14 DIAGNOSIS — Z833 Family history of diabetes mellitus: Secondary | ICD-10-CM

## 2021-02-14 DIAGNOSIS — I493 Ventricular premature depolarization: Secondary | ICD-10-CM | POA: Diagnosis present

## 2021-02-14 DIAGNOSIS — Z79899 Other long term (current) drug therapy: Secondary | ICD-10-CM

## 2021-02-14 DIAGNOSIS — Z9641 Presence of insulin pump (external) (internal): Secondary | ICD-10-CM | POA: Diagnosis present

## 2021-02-14 DIAGNOSIS — I480 Paroxysmal atrial fibrillation: Secondary | ICD-10-CM | POA: Diagnosis present

## 2021-02-14 DIAGNOSIS — K219 Gastro-esophageal reflux disease without esophagitis: Secondary | ICD-10-CM | POA: Diagnosis present

## 2021-02-14 DIAGNOSIS — N1832 Chronic kidney disease, stage 3b: Secondary | ICD-10-CM | POA: Diagnosis present

## 2021-02-14 DIAGNOSIS — M4620 Osteomyelitis of vertebra, site unspecified: Secondary | ICD-10-CM | POA: Diagnosis present

## 2021-02-14 DIAGNOSIS — E669 Obesity, unspecified: Secondary | ICD-10-CM | POA: Diagnosis present

## 2021-02-14 DIAGNOSIS — D631 Anemia in chronic kidney disease: Secondary | ICD-10-CM | POA: Diagnosis present

## 2021-02-14 DIAGNOSIS — E118 Type 2 diabetes mellitus with unspecified complications: Secondary | ICD-10-CM | POA: Diagnosis not present

## 2021-02-14 DIAGNOSIS — K580 Irritable bowel syndrome with diarrhea: Secondary | ICD-10-CM | POA: Diagnosis present

## 2021-02-14 DIAGNOSIS — Z794 Long term (current) use of insulin: Secondary | ICD-10-CM

## 2021-02-14 DIAGNOSIS — E611 Iron deficiency: Secondary | ICD-10-CM | POA: Diagnosis present

## 2021-02-14 DIAGNOSIS — I472 Ventricular tachycardia: Secondary | ICD-10-CM | POA: Diagnosis present

## 2021-02-14 DIAGNOSIS — I129 Hypertensive chronic kidney disease with stage 1 through stage 4 chronic kidney disease, or unspecified chronic kidney disease: Secondary | ICD-10-CM | POA: Diagnosis present

## 2021-02-14 HISTORY — DX: Acute kidney failure, unspecified: N17.9

## 2021-02-14 HISTORY — DX: Acute kidney failure, unspecified: N18.9

## 2021-02-14 LAB — CBC WITH DIFFERENTIAL/PLATELET
Abs Immature Granulocytes: 0.07 10*3/uL (ref 0.00–0.07)
Basophils Absolute: 0 10*3/uL (ref 0.0–0.1)
Basophils Relative: 0 %
Eosinophils Absolute: 0.2 10*3/uL (ref 0.0–0.5)
Eosinophils Relative: 2 %
HCT: 31.4 % — ABNORMAL LOW (ref 36.0–46.0)
Hemoglobin: 9.3 g/dL — ABNORMAL LOW (ref 12.0–15.0)
Immature Granulocytes: 1 %
Lymphocytes Relative: 9 %
Lymphs Abs: 0.8 10*3/uL (ref 0.7–4.0)
MCH: 31.1 pg (ref 26.0–34.0)
MCHC: 29.6 g/dL — ABNORMAL LOW (ref 30.0–36.0)
MCV: 105 fL — ABNORMAL HIGH (ref 80.0–100.0)
Monocytes Absolute: 0.5 10*3/uL (ref 0.1–1.0)
Monocytes Relative: 5 %
Neutro Abs: 7.5 10*3/uL (ref 1.7–7.7)
Neutrophils Relative %: 83 %
Platelets: 254 10*3/uL (ref 150–400)
RBC: 2.99 MIL/uL — ABNORMAL LOW (ref 3.87–5.11)
RDW: 13.9 % (ref 11.5–15.5)
WBC: 9.6 10*3/uL (ref 4.0–10.5)
nRBC: 0 % (ref 0.0–0.2)

## 2021-02-14 LAB — I-STAT CHEM 8, ED
BUN: >130 mg/dL — ABNORMAL HIGH (ref 6–20)
Calcium, Ion: 1.11 mmol/L — ABNORMAL LOW (ref 1.15–1.40)
Chloride: 107 mmol/L (ref 98–111)
Creatinine, Ser: 7.9 mg/dL — ABNORMAL HIGH (ref 0.44–1.00)
Glucose, Bld: 112 mg/dL — ABNORMAL HIGH (ref 70–99)
HCT: 27 % — ABNORMAL LOW (ref 36.0–46.0)
Hemoglobin: 9.2 g/dL — ABNORMAL LOW (ref 12.0–15.0)
Potassium: 7.7 mmol/L (ref 3.5–5.1)
Sodium: 133 mmol/L — ABNORMAL LOW (ref 135–145)
TCO2: 21 mmol/L — ABNORMAL LOW (ref 22–32)

## 2021-02-14 LAB — RENAL FUNCTION PANEL
Albumin: 2.8 g/dL — ABNORMAL LOW (ref 3.5–5.0)
Anion gap: 8 (ref 5–15)
BUN: 123 mg/dL — ABNORMAL HIGH (ref 6–20)
CO2: 22 mmol/L (ref 22–32)
Calcium: 8 mg/dL — ABNORMAL LOW (ref 8.9–10.3)
Chloride: 103 mmol/L (ref 98–111)
Creatinine, Ser: 7.67 mg/dL — ABNORMAL HIGH (ref 0.44–1.00)
GFR, Estimated: 6 mL/min — ABNORMAL LOW (ref >60)
Glucose, Bld: 182 mg/dL — ABNORMAL HIGH (ref 70–99)
Phosphorus: 8.8 mg/dL — ABNORMAL HIGH (ref 2.5–4.6)
Potassium: 7.1 mmol/L (ref 3.5–5.1)
Sodium: 133 mmol/L — ABNORMAL LOW (ref 135–145)

## 2021-02-14 LAB — RESP PANEL BY RT-PCR (FLU A&B, COVID) ARPGX2
Influenza A by PCR: NEGATIVE
Influenza B by PCR: NEGATIVE
SARS Coronavirus 2 by RT PCR: NEGATIVE

## 2021-02-14 LAB — URINALYSIS, ROUTINE W REFLEX MICROSCOPIC
Bilirubin Urine: NEGATIVE
Glucose, UA: NEGATIVE mg/dL
Ketones, ur: NEGATIVE mg/dL
Nitrite: NEGATIVE
Protein, ur: NEGATIVE mg/dL
Specific Gravity, Urine: 1.011 (ref 1.005–1.030)
pH: 5 (ref 5.0–8.0)

## 2021-02-14 LAB — BASIC METABOLIC PANEL
Anion gap: 8 (ref 5–15)
BUN: 124 mg/dL — ABNORMAL HIGH (ref 6–20)
CO2: 21 mmol/L — ABNORMAL LOW (ref 22–32)
Calcium: 8.9 mg/dL (ref 8.9–10.3)
Chloride: 105 mmol/L (ref 98–111)
Creatinine, Ser: 7.59 mg/dL — ABNORMAL HIGH (ref 0.44–1.00)
GFR, Estimated: 6 mL/min — ABNORMAL LOW (ref >60)
Glucose, Bld: 118 mg/dL — ABNORMAL HIGH (ref 70–99)
Potassium: >7.5 mmol/L (ref 3.5–5.1)
Sodium: 134 mmol/L — ABNORMAL LOW (ref 135–145)

## 2021-02-14 LAB — MAGNESIUM: Magnesium: 2.8 mg/dL — ABNORMAL HIGH (ref 1.7–2.4)

## 2021-02-14 LAB — CK: Total CK: 196 U/L (ref 38–234)

## 2021-02-14 MED ORDER — ALBUTEROL SULFATE (2.5 MG/3ML) 0.083% IN NEBU
INHALATION_SOLUTION | RESPIRATORY_TRACT | Status: AC
  Filled 2021-02-14: qty 12

## 2021-02-14 MED ORDER — SODIUM CHLORIDE 0.9 % IV BOLUS: 1000.0000 mL | Freq: Once | INTRAVENOUS | Status: DC

## 2021-02-14 MED ORDER — INSULIN REGULAR HUMAN 100 UNIT/ML IJ SOLN
10.0000 [IU] | Freq: Once | INTRAMUSCULAR | Status: AC
  Administered 2021-02-14: 10 [IU] via INTRAVENOUS
  Filled 2021-02-14: qty 10

## 2021-02-14 MED ORDER — HYDROMORPHONE HCL 2 MG PO TABS
2.0000 mg | ORAL_TABLET | ORAL | Status: DC | PRN
  Administered 2021-02-14 – 2021-02-20 (×14): 2 mg via ORAL
  Filled 2021-02-14 (×14): qty 1

## 2021-02-14 MED ORDER — INSULIN GLARGINE 100 UNIT/ML ~~LOC~~ SOLN
10.0000 [IU] | Freq: Every day | SUBCUTANEOUS | Status: DC
  Administered 2021-02-14 – 2021-02-17 (×4): 10 [IU] via SUBCUTANEOUS
  Filled 2021-02-14 (×5): qty 0.1

## 2021-02-14 MED ORDER — ATORVASTATIN CALCIUM 10 MG PO TABS
10.0000 mg | ORAL_TABLET | Freq: Every day | ORAL | Status: DC
  Administered 2021-02-15 – 2021-02-20 (×6): 10 mg via ORAL
  Filled 2021-02-14 (×6): qty 1

## 2021-02-14 MED ORDER — INSULIN REGULAR BOLUS VIA INFUSION: 10.0000 [IU] | Freq: Once | INTRAVENOUS | Status: DC

## 2021-02-14 MED ORDER — DEXTROSE 50 % IV SOLN
1.0000 | Freq: Once | INTRAVENOUS | Status: AC
  Administered 2021-02-14: 50 mL via INTRAVENOUS
  Filled 2021-02-14: qty 50

## 2021-02-14 MED ORDER — INSULIN REGULAR HUMAN 100 UNIT/ML IJ SOLN
10.0000 [IU] | Freq: Once | INTRAMUSCULAR | Status: DC
  Filled 2021-02-14: qty 3

## 2021-02-14 MED ORDER — INSULIN ASPART 100 UNIT/ML IV SOLN
5.0000 [IU] | Freq: Once | INTRAVENOUS | Status: AC
  Administered 2021-02-14: 5 [IU] via INTRAVENOUS

## 2021-02-14 MED ORDER — DEXTROSE 50 % IV SOLN
25.0000 mL | Freq: Once | INTRAVENOUS | Status: DC
  Filled 2021-02-14: qty 50

## 2021-02-14 MED ORDER — AMLODIPINE BESYLATE 5 MG PO TABS
5.0000 mg | ORAL_TABLET | Freq: Every day | ORAL | Status: DC
  Administered 2021-02-14 – 2021-02-15 (×2): 5 mg via ORAL
  Filled 2021-02-14 (×2): qty 1

## 2021-02-14 MED ORDER — HEPARIN SODIUM (PORCINE) 5000 UNIT/ML IJ SOLN
5000.0000 [IU] | Freq: Three times a day (TID) | INTRAMUSCULAR | Status: DC
  Administered 2021-02-14 – 2021-02-17 (×9): 5000 [IU] via SUBCUTANEOUS
  Filled 2021-02-14 (×8): qty 1

## 2021-02-14 MED ORDER — PANTOPRAZOLE SODIUM 40 MG PO TBEC
40.0000 mg | DELAYED_RELEASE_TABLET | Freq: Two times a day (BID) | ORAL | Status: DC
  Administered 2021-02-15 – 2021-02-20 (×11): 40 mg via ORAL
  Filled 2021-02-14 (×11): qty 1

## 2021-02-14 MED ORDER — ZOLPIDEM TARTRATE 5 MG PO TABS
5.0000 mg | ORAL_TABLET | Freq: Every evening | ORAL | Status: DC | PRN
  Administered 2021-02-16 – 2021-02-18 (×3): 5 mg via ORAL
  Filled 2021-02-14 (×4): qty 1

## 2021-02-14 MED ORDER — FUROSEMIDE 10 MG/ML IJ SOLN
80.0000 mg | Freq: Two times a day (BID) | INTRAMUSCULAR | Status: DC
  Administered 2021-02-14: 80 mg via INTRAVENOUS
  Filled 2021-02-14: qty 8

## 2021-02-14 MED ORDER — METOCLOPRAMIDE HCL 10 MG PO TABS
10.0000 mg | ORAL_TABLET | Freq: Three times a day (TID) | ORAL | Status: DC | PRN
  Administered 2021-02-16 – 2021-02-18 (×2): 10 mg via ORAL
  Filled 2021-02-14 (×2): qty 1

## 2021-02-14 MED ORDER — ACETAMINOPHEN 650 MG RE SUPP: 650.0000 mg | Freq: Four times a day (QID) | RECTAL | Status: DC | PRN

## 2021-02-14 MED ORDER — DEXTROSE 50 % IV SOLN: 25.0000 mL | Freq: Once | INTRAVENOUS | Status: DC

## 2021-02-14 MED ORDER — ALBUTEROL SULFATE (2.5 MG/3ML) 0.083% IN NEBU
10.0000 mg | INHALATION_SOLUTION | Freq: Once | RESPIRATORY_TRACT | Status: AC
  Administered 2021-02-14: 10 mg via RESPIRATORY_TRACT
  Filled 2021-02-14: qty 12

## 2021-02-14 MED ORDER — DEXTROSE 50 % IV SOLN
25.0000 mL | Freq: Once | INTRAVENOUS | Status: AC
  Administered 2021-02-14: 25 mL via INTRAVENOUS
  Filled 2021-02-14: qty 50

## 2021-02-14 MED ORDER — SODIUM CHLORIDE 0.9 % IV BOLUS
250.0000 mL | Freq: Once | INTRAVENOUS | Status: AC
  Administered 2021-02-14: 250 mL via INTRAVENOUS

## 2021-02-14 MED ORDER — LACTATED RINGERS IV BOLUS: 500.0000 mL | Freq: Once | INTRAVENOUS | Status: DC

## 2021-02-14 MED ORDER — LACTATED RINGERS IV BOLUS
500.0000 mL | Freq: Once | INTRAVENOUS | Status: AC
  Administered 2021-02-14: 500 mL via INTRAVENOUS

## 2021-02-14 MED ORDER — FUROSEMIDE 10 MG/ML IJ SOLN
80.0000 mg | Freq: Once | INTRAMUSCULAR | Status: AC
  Administered 2021-02-14: 80 mg via INTRAVENOUS
  Filled 2021-02-14: qty 8

## 2021-02-14 MED ORDER — SODIUM CHLORIDE 0.9% FLUSH
10.0000 mL | INTRAVENOUS | Status: DC | PRN
  Administered 2021-02-18 – 2021-02-19 (×2): 10 mL

## 2021-02-14 MED ORDER — CHLORHEXIDINE GLUCONATE CLOTH 2 % EX PADS
6.0000 | MEDICATED_PAD | Freq: Every day | CUTANEOUS | Status: DC
  Administered 2021-02-16 – 2021-02-20 (×5): 6 via TOPICAL

## 2021-02-14 MED ORDER — SODIUM CHLORIDE 0.9 % IV BOLUS
500.0000 mL | Freq: Once | INTRAVENOUS | Status: AC
  Administered 2021-02-14: 500 mL via INTRAVENOUS

## 2021-02-14 MED ORDER — INSULIN ASPART 100 UNIT/ML IJ SOLN: 10.0000 [IU] | Freq: Once | INTRAMUSCULAR | Status: DC

## 2021-02-14 MED ORDER — DEXTROSE 50 % IV SOLN: 1.0000 | Freq: Once | INTRAVENOUS | Status: DC

## 2021-02-14 MED ORDER — SODIUM BICARBONATE 8.4 % IV SOLN
100.0000 meq | Freq: Once | INTRAVENOUS | Status: AC
  Administered 2021-02-14: 100 meq via INTRAVENOUS
  Filled 2021-02-14: qty 100

## 2021-02-14 MED ORDER — SODIUM ZIRCONIUM CYCLOSILICATE 10 G PO PACK
10.0000 g | PACK | Freq: Two times a day (BID) | ORAL | Status: AC
  Administered 2021-02-14 – 2021-02-15 (×3): 10 g via ORAL
  Filled 2021-02-14 (×3): qty 1

## 2021-02-14 MED ORDER — SODIUM CHLORIDE 0.9% FLUSH
3.0000 mL | Freq: Two times a day (BID) | INTRAVENOUS | Status: DC
  Administered 2021-02-14 – 2021-02-20 (×11): 3 mL via INTRAVENOUS

## 2021-02-14 MED ORDER — INSULIN ASPART 100 UNIT/ML IJ SOLN
0.0000 [IU] | Freq: Three times a day (TID) | INTRAMUSCULAR | Status: DC
  Administered 2021-02-16: 3 [IU] via SUBCUTANEOUS
  Administered 2021-02-16: 4 [IU] via SUBCUTANEOUS
  Administered 2021-02-17: 3 [IU] via SUBCUTANEOUS
  Administered 2021-02-17: 5 [IU] via SUBCUTANEOUS
  Administered 2021-02-18 – 2021-02-19 (×2): 4 [IU] via SUBCUTANEOUS
  Administered 2021-02-20 (×2): 3 [IU] via SUBCUTANEOUS

## 2021-02-14 MED ORDER — CALCIUM GLUCONATE-NACL 1-0.675 GM/50ML-% IV SOLN
1.0000 g | Freq: Once | INTRAVENOUS | Status: AC
  Administered 2021-02-14: 1000 mg via INTRAVENOUS
  Filled 2021-02-14: qty 50

## 2021-02-14 MED ORDER — ACETAMINOPHEN 325 MG PO TABS
650.0000 mg | ORAL_TABLET | Freq: Four times a day (QID) | ORAL | Status: DC | PRN
  Administered 2021-02-20: 650 mg via ORAL
  Filled 2021-02-14: qty 2

## 2021-02-14 NOTE — Progress Notes (Addendum)
Nephrology follow up: Still waiting for renal panel.  Ordered stat lab this evening but it was cancelled. Called bedside RN for the lab draw as well. Also discussed with the primary team resident. Will follow lab result tonight and decide if she needs dialysis.  Katheran James, MD [facility].   Addendum 10:20 PM Lab result reviewed.  K down to 7.1, bun and cr level trending down.  Order insulin, dextrose, lasix 80 mg and NS 250 cc, sodium bicarb. Continue lokelma.  Passing urine per RN, no I/o documented yet. No need for dialysis tonight.  Discussed with primary team.

## 2021-02-14 NOTE — ED Notes (Signed)
First encounter with patient, patient alert and oriented. Updated and agreeable to plan of care. Taken to [place] at this time. Awaiting patient return.

## 2021-02-14 NOTE — H&P (Signed)
Date: 02/14/2021               Patient Name:  Emma Patton MRN: [id number]  DOB: 1971-02-23 Age / Sex: 50 y.o., female   PCP: Pcp, No         Medical Service: Internal Medicine Teaching Service         Attending Physician: Dr. Campbell Riches, MD    First Contact: Dr. Konrad Penta Pager: [phone]  Second Contact: Dr. Marva Panda Pager: [phone]       After Hours (After 5p/  First Contact Pager: [phone]  weekends / holidays): Second Contact Pager: [phone]   Chief Complaint: Shortness of Breath  History of Present Illness:   Ms. Emma Patton is a 50 y.o. extremely obese lady w/ PMHx uncontrolled T2DM w/ neuropathy and history of osteomyelitis (multiple occasions), HTN, PAF, Anemia and GERD, presenting as a transfer by EMS from [facility] for severe hyperkalemia and acute renal failure. She reports that she has been feeling increasingly confused, fatigued, "out of it", and short of breath over the past 3 days. She says she's had mild subjective fevers and chills and has had a significantly decreased appetite, stating she hasn't eaten anything over the past 3 days. She attributes this to her fatigue although notes she has also had mild nausea for which she has possibly taken Zofran chronically for at home. Endorses tremors recently. She says she's had decreased urination over this time with dark urine, although denies hematuria, dysuria, or flank pain. She notes that her sugars have been dropping more frequently over the past week. She says she was started on Bactrim about 2 days ago for LLE cellulitis. She says she noticed redness and pain over her left shin recently (cannot specify) but says her left leg is chronically more swollen than the right and that her swelling in her legs hasn't changed recently. She says she has a chronic ulcer on her right foot and has had osteomyelitis in the past including of her spine, which she attributes to lyme infection ~1.5 years ago. Denies  diarrhea, constipation, recent sick contacts, or any other symptoms.   PCP: Says she follows with Dr. Mortimer Patton she was supposed to see a nephrologist in [facility] tomorrow (Friday)   ED Course: She initially presented to [facility] where she had a foley catheter placed and was given Lasix. Here at [facility] she was given insulin, fluids, and Lokelma, and Nephrology was consulted who recommended medical management of hyperkalemia without urgent dialysis for now.   Home Medications: Lotensin 10-12.5 mg twice daily Celecoxib 200 mg twice daily Zyrtec 10 mg daily Ferrous sulfate '325mg'$  daily Lasix '80mg'$  twice daily Linzess 145 mcg daily Magnesium oxide 400 mg Reglan 10 mg before meals and at bedtime Prilosec 20 mg twice daily Zofran 4 mg every 8 hours as needed Percocet 10-3 25 every 6 hours as needed Lyrica 75 mg 3 times daily Januvia 25 mg daily Amlodipine 5 mg daily Lipitor 10 mg daily Ambien 5 mg nightly Insulin pump  Allergies: Allergies as of 02/14/2021 - Review Complete 02/14/2021  Allergen Reaction Noted  . Penicillins Rash and Hives 07/13/2014  . Bactrim [sulfamethoxazole-trimethoprim]  02/14/2021   Family History:  Father - renal disease secondary to heart disease and diabetes   Social History:  Currently lives at home with her husband.  She is independent in ADLs and uses a cane at baseline. Denies recent falls.  Denies any history of tobacco use, alcohol use, or illicit drug  use.   Review of Systems: A complete ROS was negative except as per HPI.   Physical Exam: Blood pressure (!) 151/59, pulse 79, temperature 98.1 F (36.7 C), temperature source Oral, resp. rate 20, height 6' (1.829 m), weight (!) 140.8 kg, last menstrual period 02/10/2021, SpO2 95 %.  General: Patient is obese and appears chronically ill although in no acute distress. Eyes: Sclera non-icteric. No conjunctival injection.  HENT: Moist mucus membranes. No nasal discharge. Respiratory: Lungs are CTA,  bilaterally. No wheezes, rales, or rhonchi. Normal work of breathing on room air.  Cardiovascular: Regular rate and rhythm. No murmurs, rubs, or gallops. There is 1+ RLE edema to the mid-shin and LLE 3+ pitting edema localized over the left shin with 2+ pitting edema to the knee otherwise. Distal pulses are 2+ in all four extremities.  Neurological: Awake, alert and oriented although somewhat slow answering questions. Consistently follows commands and answers questions appropriately.  MSK: Patient has myoclonus of ankles with asterixis. 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 She has severe Charcot deformities of bilateral feet L > R. She has diffusely decreased muscle strength. Abdominal: Soft and non-tender to palpation. Bowel sounds intact. No rebound or guarding. Skin: See picture below of right foot ulcer. There was no purulent drainage although there does appear to be mild necrosis with diffuse edema and onychomycosis of bilateral feet. There is scaling and skin thickening over bilateral shins and dry, peeling skin over her back. There is mild, minimally tender erythema over left shin. Psych: Slightly flat affect. Normal tone of voice.      CBC    Component Value Date/Time   WBC 9.6 02/14/2021 1202   RBC 2.99 (L) 02/14/2021 1202   HGB 9.2 (L) 02/14/2021 1245   HCT 27.0 (L) 02/14/2021 1245   PLT 254 02/14/2021 1202   MCV 105.0 (H) 02/14/2021 1202   MCH 31.1 02/14/2021 1202   MCHC 29.6 (L) 02/14/2021 1202   RDW 13.9 02/14/2021 1202   LYMPHSABS 0.8 02/14/2021 1202   MONOABS 0.5 02/14/2021 1202   EOSABS 0.2 02/14/2021 1202   BASOSABS 0.0 02/14/2021 1202   CMP     Component Value Date/Time   NA 133 (L) 02/14/2021 1245   K 7.7 (HH) 02/14/2021 1245   CL 107 02/14/2021 1245   CO2 21 (L) 02/14/2021 1202   GLUCOSE 112 (H) 02/14/2021 1245   BUN >130 (H) 02/14/2021 1245   CREATININE 7.90 (H) 02/14/2021 1245   CALCIUM 8.9 02/14/2021 1202   PROT 6.3 (L) 12/20/2019 1258    ALBUMIN 3.0 (L) 12/26/2019 0521   AST 122 (H) 12/20/2019 1258   ALT 211 (H) 12/20/2019 1258   ALKPHOS 133 (H) 12/20/2019 1258   BILITOT 0.6 12/20/2019 1258   GFRNONAA 6 (L) 02/14/2021 1202   GFRAA 45 (L) 12/26/2019 0521   Urinalysis    Component Value Date/Time   COLORURINE YELLOW 02/14/2021 [place] 02/14/2021 1509   LABSPEC 1.011 02/14/2021 1509   PHURINE 5.0 02/14/2021 [place] 02/14/2021 1509   HGBUR MODERATE (A) 02/14/2021 [place] 02/14/2021 [place] 02/14/2021 [place] 02/14/2021 1509   NITRITE NEGATIVE 02/14/2021 1509   LEUKOCYTESUR TRACE (A) 02/14/2021 1509   EKG: personally reviewed my interpretation is sinus rhythm at 70bpm, widened QRS complexes with non-specific conduction abnormality, although without obviously peaked T waves or PR prolongation.  RENAL US: IMPRESSION: No significant sonographic abnormality of the  kidneys.  Assessment & Plan by Problem: Active Problems:   Acute on chronic renal failure ([place])  Ms. Dominga Lammons is a 50 year old female with past medical history of hypertension, diabetes, inflammatory bowel syndrome, chronic osteomyelitis of spine, anemia and GERD admitted for acute on chronic renal failure with hyperkalemia.  Acute on chronic renal failure Hyperkalemia Patient with several days of dyspnea, fatigue, and decreased urination in setting of decreased oral intake and recent Bactrim use for cellulitis presenting with acute on chronic renal failure from outside hospital.  Patient serum creatinine increased to 7.9 (baseline 1.54) with hyperkalemia to 7.7 here (>8 at outside facility). Renal US without acute findings. She does have EKG changes consistent with severe hyperkalemia including widened QRS although does not have clearly peaked T waves. Patient was given IV calcium gluconate, insulin with D50, lasix and lokelma. She was also given fluid boluses  up to 1.5L. Suspect patient's acute on chronic renal failure is likely pre-renal vs ATN in setting of decreased oral intake with ongoing diuresis (on Lasix '80mg'$  bid at baseline) and antihypertensive regimen of benazepril-HCTZ with recent Bactrim use. Urinalysis did have hemoglobinuria without RBC's, concerning for possible rhabdomyolysis. Hyaline casts present. Nephrology consulted and recommended for continued medical management at this time. It is possible CHF could be contributing with LE edema and SOB although anticipate this is due to ARF as ECHO 1 year ago showed normal EF without other acute findings.  - Nephrology consulted, appreciate their recommendations  - Renal function panel q4h  - Continue Lokelma '10mg'$  twice daily - IV Lasix '80mg'$  twice daily  - Small LR boluses  - Holding nephrotoxic agents and renally adjusting all medications  - F/u CK, magnesium  Macrocytic anemia Patient with chronic normocytic anemia with average Hb 9-10. Hb 9.3 with MCV 105. Patient does have history of inflammatory bowel syndrome and may potentially have malabsorption.  - CBC daily - Vitamin B12, folate, iron panel  Chronic bilateral lower extremity edema R foot ulceration  Patient endorses chronic bilateral lower extremity edema. This is non-pitting on exam. She notes recently being treated for left lower extremity cellulitis with bactrim. There is no evidence of active infection at this time and changes are more consistent with chronic venous stasis. She is on lasix '80mg'$  twice daily without evidence of heart failure. Her most recent Echo in March 2021 with EF 55-60% with normal diastolic parameters. Suspect her lasix dosing is for her chronic bilateral lower extremity. She does have a right foot ulcer as noted above which she reports is chronic. There has been no change recently and it does not appear to be acutely infected at this time. - WOC consult for R foot ulcer - Continue to monitor for any signs of  hypervolemia - Consider compression stockings for lower extremity edema on discharge  Diabetes mellitus Most recent HbA1c 10.9. Patient recently started on insulin pump and notes that her CBG's have been "all over the place". Glucose on labs have been in 100's during this admission.  - Lantus 10U + SSI (resistant) - CBG monitoring closely as her requirements are likely lower with RF   Hypertension Currently normotensive. Holding benazepril-HCTZ in setting of AKI on CKD as above.  - Continue amlodipine '5mg'$  daily   Hyperlipidemia: - Continue lipitor '10mg'$  daily   Chronic osteomyelitis of spine Chronic pain syndrome Patient is on percocet 10-'325mg'$  q6h as needed for pain. Given her acute on chronic renal failure, opiate of choice would be hydromorphone at this time  until recovery of renal function. - Dilaudid '2mg'$  q3h prn  - Continue to monitor   Diet: Renal/carb modified Fluids: LR 500 cc bolus DVT prophylaxis: Heparin 5000 units every 8 hours Bowel regimen: None CODE STATUS: Full  Dispo: Admit patient to Inpatient with expected length of stay greater than 2 midnights.  Signed: Jeralyn Bennett, MD 02/14/2021, 9:15 PM  Pager: [phone] After 5pm on weekdays and 1pm on weekends: On Call pager: [phone]

## 2021-02-14 NOTE — Progress Notes (Signed)
With Nephrologist's order; "OK for Midline" due to poor access and critical lab results. Midline inserted.

## 2021-02-14 NOTE — ED Triage Notes (Signed)
Pt to ED via EMS from [facility] for emergent dialysis. Pt presented to [facility] ED last night c/o SHOB. Pt noted to be hyperkalemic. Last k + 8.3. Per transfer  record pt received Insulin 10 units at 1010 and calcium gluconate at 1030.  [id number], DM, CHF. Indwelling foley in place. #20 R arm. LAST vs:  132/75, 75, 99% 4L. CBG 156.

## 2021-02-14 NOTE — Consult Note (Addendum)
[facility] Nephrology Consultation Note  Requesting MD: Dr Deno Etienne Reason for consult: AKI, Hyperkalemia  HPI:  Emma Patton is a 50 y.o. female with obesity, diabetes II, hypertension, HLD, acid reflux, chronic pain, CKD who was transferred from [facility] seen as a consultation for acute kidney injury and hyperkalemia. Patient reported that she went to [facility] emergency yesterday because of generalized weakness, shortness of breath and not able to pass urine for 24 hours.  In the ER, she was found to have serum creatinine level of 7 with potassium level more than 8.  She was medically treated with insulin, dextrose, Lokelma, bicarbonate and diuretics.  The Foley catheter was inserted increased urine output.  Exact amount of urine unknown however I noticed around 800 cc of urine in the bag. She has underlying CKD, reportedly stage IV and has appointment with her nephrologist this week at [facility].  The CKD was thought to be due to diabetes.  The exact baseline creatinine level unknown however it seems to be around 1.5-2.  She was hospitalized in 11/2019 for acute encephalopathy and acute kidney injury requiring CRRT thought to be due to polypharmacy, hypotension, ARB and NSAIDs.  Subsequently she had renal recovery. On arrival to [facility] ER, the blood pressure is 110/51, requiring 3-4 L of oxygen with 100% saturation.  The labs showed serum sodium level 133, potassium 7.7, BUN >130 and a creatinine level of 7.9.  Other test results are not available to review at this time. Patient is alert awake and reports minimal nausea which is chronic.  She denies dysgeusia, chest pain, headache or dizziness.  The shortness of breath is much better now per patient.  She has chronic left leg edema and trace edema on the right leg. The home medications include amlodipine, Lipitor, benazepril-HCTZ, Celebrex, Lasix 80 mg twice daily, insulin, Reglan, Januvia,  Lyrica.   Creatinine, Ser  Date/Time Value Ref Range Status  02/14/2021 12:45 PM 7.90 (H) 0.44 - 1.00 mg/dL Final  12/26/2019 05:21 AM 1.54 (H) 0.44 - 1.00 mg/dL Final  12/25/2019 07:31 AM 1.51 (H) 0.44 - 1.00 mg/dL Final  12/24/2019 05:10 AM 1.68 (H) 0.44 - 1.00 mg/dL Final  12/23/2019 04:49 AM 1.80 (H) 0.44 - 1.00 mg/dL Final  12/22/2019 03:52 AM 2.09 (H) 0.44 - 1.00 mg/dL Final  12/21/2019 04:41 PM 2.27 (H) 0.44 - 1.00 mg/dL Final  12/21/2019 03:47 AM 3.09 (H) 0.44 - 1.00 mg/dL Final  12/20/2019 04:45 PM 3.83 (H) 0.44 - 1.00 mg/dL Final  12/20/2019 12:58 PM 3.77 (H) 0.44 - 1.00 mg/dL Final  12/20/2019 07:15 AM 3.76 (H) 0.44 - 1.00 mg/dL Final  12/20/2019 05:28 AM 3.70 (H) 0.44 - 1.00 mg/dL Final  12/19/2019 09:25 PM 3.52 (H) 0.44 - 1.00 mg/dL Final     PMHx:  No past medical history on file.   The histories are not reviewed yet. Please review them in the "History" navigator section and refresh this [place].  Family Hx: No family history on file.  Social History:  has an unknown smoking status. She has never used smokeless tobacco. No history on file for alcohol use and drug use.  Allergies:  Allergies  Allergen Reactions  . Penicillins Rash and Hives  . Bactrim [Sulfamethoxazole-Trimethoprim]     MD mentioned kidney stress    Medications: Prior to Admission medications   Medication Sig Start Date End Date Taking? Authorizing Provider  amLODipine (NORVASC) 5 MG tablet Take 5 mg by mouth daily.    [provider]  atorvastatin (LIPITOR) 10 MG tablet Take 10 mg by mouth daily.    [provider]  benazepril-hydrochlorthiazide (LOTENSIN HCT) 10-12.5 MG tablet Take 1 tablet by mouth in the morning and at bedtime.    [provider]  celecoxib (CELEBREX) 200 MG capsule Take 200 mg by mouth 2 (two) times daily.    [provider]  cetirizine (ZYRTEC) 10 MG tablet Take 10 mg by mouth daily.    [provider]  Ergocalciferol  (VITAMIN D2 PO) Take 1.25 mg by mouth 2 (two) times a week.    [provider]  ferrous sulfate 325 (65 FE) MG tablet Take 325 mg by mouth daily with breakfast.    [provider]  furosemide (LASIX) 40 MG tablet Take 80 mg by mouth 2 (two) times daily.  06/12/15   [provider]  Insulin Disposable Pump (OMNIPOD STARTER) KIT by Does not apply route See admin instructions. For continuous Humalog insulin use    [provider]  insulin lispro (HUMALOG) 100 UNIT/ML injection Inject into the skin See admin instructions. For use in insulin pump    [provider]  linaclotide (LINZESS) 145 MCG CAPS capsule Take 145 mcg by mouth daily.  07/15/16   [provider]  magnesium oxide (MAG-OX) 400 MG tablet Take 400 mg by mouth daily.    [provider]  metoCLOPramide (REGLAN) 10 MG tablet Take 10 mg by mouth See admin instructions. Take 63m by mouth before meals and at bedtime.    [provider]  omeprazole (PRILOSEC) 20 MG capsule Take 20 mg by mouth in the morning and at bedtime.  12/05/15   [provider]  ondansetron (ZOFRAN) 4 MG tablet Take 4 mg by mouth every 8 (eight) hours as needed for nausea or vomiting.    [provider]  oxyCODONE-acetaminophen (PERCOCET) 10-325 MG tablet Take 1 tablet by mouth every 6 (six) hours as needed for pain.  04/02/16   [provider]  pregabalin (LYRICA) 75 MG capsule Take 75 mg by mouth 3 (three) times daily.  07/22/16   [provider]  sitaGLIPtin (JANUVIA) 25 MG tablet Take 25 mg by mouth daily.    [provider]  zolpidem (AMBIEN) 10 MG tablet Take 0.5 tablets (5 mg total) by mouth at bedtime as needed for sleep. 12/26/19   LLittle Ishikawa MD    I have reviewed the patient's current medications.  Labs:  Results for orders placed or performed during the hospital encounter of 02/14/21 (from the past 48 hour(s))  I-stat chem 8, ED ([facility]     Status: Abnormal   Collection Time: 02/14/21 12:45 PM  Result Value Ref Range   Sodium 133 (L) 135 - 145 mmol/L   Potassium 7.7 (HH) 3.5 - 5.1 mmol/L   Chloride 107 98 - 111 mmol/L   BUN >130 (H) 6 - 20 mg/dL   Creatinine, Ser 7.90 (H) 0.44 - 1.00 mg/dL   Glucose, Bld 112 (H) 70 - 99 mg/dL    Comment: Glucose reference range applies only to samples taken after fasting for at least 8 hours.   Calcium, Ion 1.11 (L) 1.15 - 1.40 mmol/L   TCO2 21 (L) 22 - 32 mmol/L   Hemoglobin 9.2 (L) 12.0 - 15.0 g/dL   HCT 27.0 (L) 36.0 - 46.0 %   Comment NOTIFIED PHYSICIAN      ROS: As per H&P.  Other systems reviewed  and negative.  Physical Exam: Vitals:   02/14/21 1245 02/14/21 1300  BP: (!) 113/53 (!) 110/57  Pulse: 67 69  Resp: 16 14  Temp:    SpO2: 99% 99%     General exam: Appears calm and comfortable  Respiratory system: Distant breath sounds with basal rhonchi Cardiovascular system: S1 & S2 heard, RRR, no rubs.  Left leg edema/lymphedema, right leg has trace edema. Gastrointestinal system: Abdomen is soft and nontender. Normal bowel sounds heard. Central nervous system: Alert and oriented. No asterixis Extremities: edema L leg>R Skin: No rashes, lesions or ulcers Psychiatry: Judgement and insight appear normal. Mood & affect appropriate.   Assessment/Plan:  #Acute kidney injury on CKD, nonoliguric likely due to obstructive uropathy concomitant with the use of diuretics,  ARB and NSAIDs. Patient had similar presentation/encephalopathy in 11/2019 required RRT with subsequent renal recovery. Management of hyperkalemia as below.  Patient has chronic nausea but no overt uremic symptoms.  She has Foley catheter inserted with 800 cc urine in the bag. We will attempt to manage medically first, especially since she already has a Foley catheter.  I will check a UA, CK, renal ultrasound, renal panel every 4 hour. Strict ins and out. If no improvement in renal function or  persistent hyperkalemia with the medical management then, she will need hemodialysis.  Patient agreed with the plan.  #Severe hyperkalemia due to AKI, ARB use.  Received temporizing measure in [facility] ER.  The repeat potassium level is down to 7.7 from 8.6.  EKG without significant change from previous one.  I will order IV Lasix with some IV fluid to increase potassium excretion, Lokelma, insulin dextrose and calcium.  BMP every 4 hour. Patient understand that she may need dialysis if no improvement with above measures.  #Hyponatremia likely hypervolemic: Treating with diuretics.  Monitor lab.  Check urine sodium, osmolality.  #Hypertension: Blood pressure acceptable.  Recommend to hold ACE inhibitor or ARB.  Diuretics as above.  #Anemia of CKD: Monitor hemoglobin.  Need outpatient follow-up for ESA.  Discussed with the ER physician and RN. We will follow with you.   Willene Holian Tanna Furry 02/14/2021, 1:46 PM  [facility].

## 2021-02-14 NOTE — Progress Notes (Addendum)
UPDATE NOTE  This is a 50 year old chronically ill female admitted earlier today for acute renal failure. Admission labs with creatinine 7.9, potassium 7.7. She was admitted and has been receiving medical treatment for the hyperkalemia with insulin, lokelma, lasix. Due to the severity of renal failure, q4h renal function panels have been ordered. Unfortunately, multiple attempts have been made to draw blood without success.  I have placed an order for a midline to be placed urgently due to high risk for further decompensation in the setting of severe renal failure.  Dr. Carolin Sicks with nephrology is aware of the situation. We will monitor labs closely throughout tonight. If potassium continues to worsen, will likely need emergent HD overnight.  Addendum #1 02/15/21 5:51 AM  Morning labs returned with an increase in potassium 7.1>7.5. Insulin/dextrose, calcium gluconate reordered. Next dose of lokelma due at 1000. UOP since 1900 has been around 600cc.  I evaluated the patient at bedside. Discussed the plans for medical therapy at this time and with high liklihood that she will need HD which she was agreeable to.  Discussed with Dr. Carolin Sicks who agrees with plans to proceed with HD this morning once vascular access is placed.  I spoke with IR who says that they do not place central access lines after their regular hours.  I spoke with the PCCM team who was agreeable to place the line and that they would likely be able to do this within the next 1-2 hours.  I have spoke with Christia Reading, bedside RN regarding the plans and appreciate him obtaining the necessary supplies.   Mitzi Hansen, MD Internal Medicine Resident PGY-2 [facility] Pager: [phone] 02/14/2021 9:19 PM

## 2021-02-14 NOTE — ED Provider Notes (Signed)
[place] EMERGENCY DEPARTMENT Provider Note   CSN: [id number] Arrival date & time:        History Chief Complaint  Patient presents with  . diaylsis  . hyperkalemia    Emma Patton is a 50 y.o. female.  50 yo F with a chief complaints of shortness of breath.  This been going on for a couple days and she is also been having some trouble urinating.  She went to an outside hospital and was evaluated.  Found to have hyperkalemia with acute renal failure and was sent here for urgent dialysis.  Not on dialysis at baseline.  Had a Foley catheter placed and was given Lasix and has had some urine output.  She denies any issues breathing currently.  Denies cough congestion or fever.  The history is provided by the patient and the EMS personnel.  Illness Severity:  Moderate Onset quality:  Gradual Duration:  2 days Timing:  Constant Progression:  Worsening Chronicity:  New Associated symptoms: shortness of breath   Associated symptoms: no chest pain, no congestion, no fever, no headaches, no myalgias, no nausea, no rhinorrhea, no vomiting and no wheezing        History reviewed. No pertinent past medical history.  Patient Active Problem List   Diagnosis Date Noted  . Toxic encephalopathy 12/26/2019  . Diabetic foot infection ([place]) 12/26/2019  . Acute hypoxemic respiratory failure ([place]) 12/20/2019  . Claustrophobia 09/04/2016  . Esophageal reflux 09/04/2016  . Leukocytosis 09/04/2016  . Osteomyelitis of toe of left foot ([place]) 09/04/2016  . Acute renal failure (ARF) ([place]) 03/21/2016  . Anemia 03/21/2016  . Gastroesophageal reflux disease 03/21/2016  . HTN (hypertension), benign 03/21/2016  . Chronic bilateral low back pain with right-sided sciatica 02/18/2016  . Discitis of lumbar region 02/18/2016  . 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 Osteomyelitis of spine ([place]) 02/18/2016  . Spinal stenosis 02/18/2016  . Lyme disease 12/20/2015   . Polypharmacy 06/24/2015  . PAF (paroxysmal atrial fibrillation) ([place]) 06/24/2015  . Baker's cyst of knee 06/12/2015  . Bilateral leg edema 06/12/2015  . Edema of lower extremity 06/12/2015  . Hypoalbuminemia 06/12/2015  . Diabetes mellitus ([place]) 06/04/2015  . DM type 2, uncontrolled, with neuropathy ([place]) 06/04/2015  . Elevated alkaline phosphatase level 06/04/2015  . 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 No pertinent surgical history.   OB History   No obstetric history on file.     No family history on file.  Social History   Tobacco Use  . Smoking status: Unknown If Ever Smoked  . Smokeless tobacco: Never Used    Home Medications Prior to Admission medications   Medication Sig Start Date End Date Taking? Authorizing Provider  amLODipine (NORVASC) 5 MG tablet Take 5 mg by mouth daily.    [provider]  atorvastatin (LIPITOR) 10 MG tablet Take 10 mg by mouth daily.    [provider]  benazepril-hydrochlorthiazide (LOTENSIN HCT) 10-12.5 MG tablet Take 1 tablet by mouth in the morning and at bedtime.    [provider]  celecoxib (CELEBREX) 200 MG capsule Take 200 mg by mouth 2 (two) times daily.    [provider]  cetirizine (ZYRTEC) 10 MG tablet Take 10 mg by mouth daily.    [provider]  Ergocalciferol (VITAMIN D2 PO) Take 1.25 mg by mouth 2 (two) times a week.    [provider]  ferrous sulfate 325 (65 FE) MG tablet Take 325  mg by mouth daily with breakfast.    [provider]  furosemide (LASIX) 40 MG tablet Take 80 mg by mouth 2 (two) times daily.  06/12/15   [provider]  Insulin Disposable Pump (OMNIPOD STARTER) KIT by Does not apply route See admin instructions. For continuous Humalog insulin use    [provider]  insulin lispro (HUMALOG) 100 UNIT/ML injection Inject into the skin See admin instructions. For use in insulin pump    [provider]   linaclotide (LINZESS) 145 MCG CAPS capsule Take 145 mcg by mouth daily.  07/15/16   [provider]  magnesium oxide (MAG-OX) 400 MG tablet Take 400 mg by mouth daily.    [provider]  metoCLOPramide (REGLAN) 10 MG tablet Take 10 mg by mouth See admin instructions. Take 69m by mouth before meals and at bedtime.    [provider]  omeprazole (PRILOSEC) 20 MG capsule Take 20 mg by mouth in the morning and at bedtime.  12/05/15   [provider]  ondansetron (ZOFRAN) 4 MG tablet Take 4 mg by mouth every 8 (eight) hours as needed for nausea or vomiting.    [provider]  oxyCODONE-acetaminophen (PERCOCET) 10-325 MG tablet Take 1 tablet by mouth every 6 (six) hours as needed for pain.  04/02/16   [provider]  pregabalin (LYRICA) 75 MG capsule Take 75 mg by mouth 3 (three) times daily.  07/22/16   [provider]  sitaGLIPtin (JANUVIA) 25 MG tablet Take 25 mg by mouth daily.    [provider]  zolpidem (AMBIEN) 10 MG tablet Take 0.5 tablets (5 mg total) by mouth at bedtime as needed for sleep. 12/26/19   LLittle Ishikawa MD    Allergies    Penicillins and Bactrim [sulfamethoxazole-trimethoprim]  Review of Systems   Review of Systems  Constitutional: Negative for chills and fever.  HENT: Negative for congestion and rhinorrhea.   Eyes: Negative for redness and visual disturbance.  Respiratory: Positive for shortness of breath. Negative for wheezing.   Cardiovascular: Negative for chest pain and palpitations.  Gastrointestinal: Negative for nausea and vomiting.  Genitourinary: Negative for dysuria and urgency.  Musculoskeletal: Negative for arthralgias and myalgias.  Skin: Negative for pallor and wound.  Neurological: Negative for dizziness and headaches.    Physical Exam Updated Vital Signs BP (!) 110/57   Pulse 69   Temp 97.8 F (36.6 C) (Oral)   Resp 14   Ht 6' (1.829 m)   Wt 136.1 kg   LMP 02/10/2021    SpO2 99%   BMI 40.69 kg/m   Physical Exam Vitals and nursing note reviewed.  Constitutional:      General: She is not in acute distress.    Appearance: She is well-developed. She is not diaphoretic.  HENT:     Head: Normocephalic and atraumatic.  Eyes:     Pupils: Pupils are equal, round, and reactive to light.  Cardiovascular:     Rate and Rhythm: Normal rate and regular rhythm.     Heart sounds: No murmur heard. No friction rub. No gallop.   Pulmonary:     Effort: Pulmonary effort is normal.     Breath sounds: No wheezing or rales.  Abdominal:     General: There is no distension.     Palpations: Abdomen is soft.     Tenderness: There is no abdominal tenderness.  Musculoskeletal:        General: No tenderness.  Cervical back: Normal range of motion and neck supple.  Skin:    General: Skin is warm and dry.  Neurological:     Mental Status: She is alert and oriented to person, place, and time.  Psychiatric:        Behavior: Behavior normal.     ED Results / Procedures / Treatments   Labs (all labs ordered are listed, but only abnormal results are displayed) Labs Reviewed  BASIC METABOLIC PANEL - Abnormal; Notable for the following components:      Result Value   Sodium 134 (*)    Potassium >7.5 (*)    CO2 21 (*)    Glucose, Bld 118 (*)    BUN 124 (*)    Creatinine, Ser 7.59 (*)    GFR, Estimated 6 (*)    All other components within normal limits  I-STAT CHEM 8, ED - Abnormal; Notable for the following components:   Sodium 133 (*)    Potassium 7.7 (*)    BUN >130 (*)    Creatinine, Ser 7.90 (*)    Glucose, Bld 112 (*)    Calcium, Ion 1.11 (*)    TCO2 21 (*)    Hemoglobin 9.2 (*)    HCT 27.0 (*)    All other components within normal limits  RESP PANEL BY RT-PCR (FLU A&B, COVID) ARPGX2  CBC WITH DIFFERENTIAL/PLATELET  URINALYSIS, ROUTINE W REFLEX MICROSCOPIC  CK  RENAL FUNCTION PANEL  RENAL FUNCTION PANEL  RENAL FUNCTION PANEL    EKG EKG  Interpretation  Date/Time:  Thursday Feb 14 2021 12:04:46 EDT Ventricular Rate:  70 PR Interval:  172 QRS Duration: 116 QT Interval:  389 QTC Calculation: 420 R Axis:   -12 Text Interpretation: Sinus rhythm Nonspecific intraventricular conduction delay Low voltage, extremity and precordial leads No significant change since last tracing Confirmed by Deno Etienne [phone]) on 02/14/2021 12:13:19 PM   Radiology No results found.  Procedures Procedures   Medications Ordered in ED Medications  albuterol (PROVENTIL) (2.5 MG/3ML) 0.083% nebulizer solution 10 mg (has no administration in time range)  insulin aspart (novoLOG) injection 5 Units (has no administration in time range)    And  dextrose 50 % solution 50 mL (has no administration in time range)  furosemide (LASIX) injection 80 mg (has no administration in time range)  sodium zirconium cyclosilicate (LOKELMA) packet 10 g (has no administration in time range)  sodium chloride 0.9 % bolus 500 mL (has no administration in time range)  calcium gluconate 1 g/ 50 mL sodium chloride IVPB (1,000 mg Intravenous New Bag/Given 02/14/21 1235)    ED Course  I have reviewed the triage vital signs and the nursing notes.  Pertinent labs & imaging results that were available during my care of the patient were reviewed by me and considered in my medical decision making (see chart for details).    MDM Rules/Calculators/A&P                          50 yo F sent here for urgent dialysis by an outside hospital without dialysis inpatient ability.  To be post renal as the patient has a Foley catheter placed and has had some urine output since.  We will give a bolus of IV fluids here.  Recheck blood work.  Will discuss with nephrology.  Nephrology evaluated the patient at bedside.  Thought could be post renal and was recommending medical therapy prior to starting dialysis.  Wanted  to give a bolus of IV fluids 80 of Lasix and Lokelma.  Will discuss with  medicine for admission.  CRITICAL CARE Performed by: Cecilio Asper   Total critical care time: 80 minutes  Critical care time was exclusive of separately billable procedures and treating other patients.  Critical care was necessary to treat or prevent imminent or life-threatening deterioration.  Critical care was time spent personally by me on the following activities: development of treatment plan with patient and/or surrogate as well as nursing, discussions with consultants, evaluation of patient's response to treatment, examination of patient, obtaining history from patient or surrogate, ordering and performing treatments and interventions, ordering and review of laboratory studies, ordering and review of radiographic studies, pulse oximetry and re-evaluation of patient's condition.  The patients results and plan were reviewed and discussed.   Any x-rays performed were independently reviewed by myself.   Differential diagnosis were considered with the presenting HPI.  Medications  albuterol (PROVENTIL) (2.5 MG/3ML) 0.083% nebulizer solution 10 mg (has no administration in time range)  insulin aspart (novoLOG) injection 5 Units (has no administration in time range)    And  dextrose 50 % solution 50 mL (has no administration in time range)  furosemide (LASIX) injection 80 mg (has no administration in time range)  sodium zirconium cyclosilicate (LOKELMA) packet 10 g (has no administration in time range)  sodium chloride 0.9 % bolus 500 mL (has no administration in time range)  calcium gluconate 1 g/ 50 mL sodium chloride IVPB (1,000 mg Intravenous New Bag/Given 02/14/21 1235)    Vitals:   02/14/21 1203 02/14/21 1204 02/14/21 1245 02/14/21 1300  BP:  (!) 110/51 (!) 113/53 (!) 110/57  Pulse:  68 67 69  Resp:  _0 Temp:  97.8 F (36.6 C)    TempSrc:  Oral    SpO2:  100% 99% 99%  Weight: 136.1 kg     Height: 6' (1.829 m)       Final diagnoses:  AKI (acute kidney  injury) ([place])  Hyperkalemia    Admission/ observation were discussed with the admitting physician, patient and/or family and they are comfortable with the plan.    Final Clinical Impression(s) / ED Diagnoses Final diagnoses:  AKI (acute kidney injury) ([place])  Hyperkalemia    Rx / DC Orders ED Discharge Orders    None       Deno Etienne, DO 02/14/21 1356

## 2021-02-15 ENCOUNTER — Inpatient Hospital Stay (HOSPITAL_COMMUNITY): Payer: Medicare Other

## 2021-02-15 DIAGNOSIS — N189 Chronic kidney disease, unspecified: Secondary | ICD-10-CM

## 2021-02-15 DIAGNOSIS — Z452 Encounter for adjustment and management of vascular access device: Secondary | ICD-10-CM | POA: Diagnosis not present

## 2021-02-15 DIAGNOSIS — N179 Acute kidney failure, unspecified: Principal | ICD-10-CM

## 2021-02-15 DIAGNOSIS — E1165 Type 2 diabetes mellitus with hyperglycemia: Secondary | ICD-10-CM

## 2021-02-15 DIAGNOSIS — E118 Type 2 diabetes mellitus with unspecified complications: Secondary | ICD-10-CM

## 2021-02-15 DIAGNOSIS — E875 Hyperkalemia: Secondary | ICD-10-CM | POA: Diagnosis present

## 2021-02-15 HISTORY — DX: Hyperkalemia: E87.5

## 2021-02-15 LAB — VITAMIN B12
Vitamin B-12: 144 pg/mL — ABNORMAL LOW (ref 180–914)
Vitamin B-12: 127 pg/mL — ABNORMAL LOW (ref 180–914)

## 2021-02-15 LAB — RENAL FUNCTION PANEL
Albumin: 2.7 g/dL — ABNORMAL LOW (ref 3.5–5.0)
Albumin: 2.7 g/dL — ABNORMAL LOW (ref 3.5–5.0)
Albumin: 2.7 g/dL — ABNORMAL LOW (ref 3.5–5.0)
Anion gap: 8 (ref 5–15)
Anion gap: 8 (ref 5–15)
Anion gap: 7 (ref 5–15)
BUN: 123 mg/dL — ABNORMAL HIGH (ref 6–20)
BUN: 128 mg/dL — ABNORMAL HIGH (ref 6–20)
BUN: 61 mg/dL — ABNORMAL HIGH (ref 6–20)
CO2: 25 mmol/L (ref 22–32)
CO2: 22 mmol/L (ref 22–32)
CO2: 28 mmol/L (ref 22–32)
Calcium: 8 mg/dL — ABNORMAL LOW (ref 8.9–10.3)
Calcium: 7.7 mg/dL — ABNORMAL LOW (ref 8.9–10.3)
Calcium: 7.8 mg/dL — ABNORMAL LOW (ref 8.9–10.3)
Chloride: 101 mmol/L (ref 98–111)
Chloride: 101 mmol/L (ref 98–111)
Chloride: 99 mmol/L (ref 98–111)
Creatinine, Ser: 7.53 mg/dL — ABNORMAL HIGH (ref 0.44–1.00)
Creatinine, Ser: 7.62 mg/dL — ABNORMAL HIGH (ref 0.44–1.00)
Creatinine, Ser: 4.66 mg/dL — ABNORMAL HIGH (ref 0.44–1.00)
GFR, Estimated: 6 mL/min — ABNORMAL LOW (ref >60)
GFR, Estimated: 6 mL/min — ABNORMAL LOW (ref >60)
GFR, Estimated: 11 mL/min — ABNORMAL LOW (ref >60)
Glucose, Bld: 204 mg/dL — ABNORMAL HIGH (ref 70–99)
Glucose, Bld: 207 mg/dL — ABNORMAL HIGH (ref 70–99)
Glucose, Bld: 110 mg/dL — ABNORMAL HIGH (ref 70–99)
Phosphorus: 9.3 mg/dL — ABNORMAL HIGH (ref 2.5–4.6)
Phosphorus: 9.7 mg/dL — ABNORMAL HIGH (ref 2.5–4.6)
Phosphorus: 5.9 mg/dL — ABNORMAL HIGH (ref 2.5–4.6)
Potassium: >7.5 mmol/L (ref 3.5–5.1)
Potassium: >7.5 mmol/L (ref 3.5–5.1)
Potassium: 5.4 mmol/L — ABNORMAL HIGH (ref 3.5–5.1)
Sodium: 134 mmol/L — ABNORMAL LOW (ref 135–145)
Sodium: 131 mmol/L — ABNORMAL LOW (ref 135–145)
Sodium: 134 mmol/L — ABNORMAL LOW (ref 135–145)

## 2021-02-15 LAB — CBC
HCT: 27.4 % — ABNORMAL LOW (ref 36.0–46.0)
Hemoglobin: 8.3 g/dL — ABNORMAL LOW (ref 12.0–15.0)
MCH: 31.1 pg (ref 26.0–34.0)
MCHC: 30.3 g/dL (ref 30.0–36.0)
MCV: 102.6 fL — ABNORMAL HIGH (ref 80.0–100.0)
Platelets: 333 10*3/uL (ref 150–400)
RBC: 2.67 MIL/uL — ABNORMAL LOW (ref 3.87–5.11)
RDW: 14.2 % (ref 11.5–15.5)
WBC: 9.1 10*3/uL (ref 4.0–10.5)
nRBC: 0.3 % — ABNORMAL HIGH (ref 0.0–0.2)

## 2021-02-15 LAB — GLUCOSE, CAPILLARY
Glucose-Capillary: 156 mg/dL — ABNORMAL HIGH (ref 70–99)
Glucose-Capillary: 190 mg/dL — ABNORMAL HIGH (ref 70–99)
Glucose-Capillary: 105 mg/dL — ABNORMAL HIGH (ref 70–99)
Glucose-Capillary: 129 mg/dL — ABNORMAL HIGH (ref 70–99)
Glucose-Capillary: 138 mg/dL — ABNORMAL HIGH (ref 70–99)

## 2021-02-15 LAB — HEPATITIS B SURFACE ANTIBODY,QUALITATIVE: Hep B S Ab: NONREACTIVE

## 2021-02-15 LAB — FERRITIN: Ferritin: 180 ng/mL (ref 11–307)

## 2021-02-15 LAB — IRON AND TIBC
Iron: 26 ug/dL — ABNORMAL LOW (ref 28–170)
Saturation Ratios: 9 % — ABNORMAL LOW (ref 10.4–31.8)
TIBC: 298 ug/dL (ref 250–450)
UIBC: 272 ug/dL

## 2021-02-15 LAB — HIV ANTIBODY (ROUTINE TESTING W REFLEX): HIV Screen 4th Generation wRfx: NONREACTIVE

## 2021-02-15 LAB — HEPATITIS B SURFACE ANTIGEN: Hepatitis B Surface Ag: NONREACTIVE

## 2021-02-15 LAB — HEPATITIS B CORE ANTIBODY, IGM: Hep B C IgM: NONREACTIVE

## 2021-02-15 LAB — TSH: TSH: 0.609 u[IU]/mL (ref 0.350–4.500)

## 2021-02-15 LAB — FOLATE: Folate: 5.3 ng/mL — ABNORMAL LOW (ref >5.9)

## 2021-02-15 MED ORDER — CHLORHEXIDINE GLUCONATE CLOTH 2 % EX PADS
6.0000 | MEDICATED_PAD | Freq: Every day | CUTANEOUS | Status: DC
  Administered 2021-02-15 – 2021-02-20 (×6): 6 via TOPICAL

## 2021-02-15 MED ORDER — HEPARIN SODIUM (PORCINE) 1000 UNIT/ML DIALYSIS: 1000.0000 [IU] | INTRAMUSCULAR | Status: DC | PRN

## 2021-02-15 MED ORDER — HEPARIN SODIUM (PORCINE) 1000 UNIT/ML IJ SOLN
INTRAMUSCULAR | Status: AC
  Administered 2021-02-15: 2400 [IU] via INTRAVENOUS_CENTRAL
  Filled 2021-02-15: qty 3

## 2021-02-15 MED ORDER — INSULIN ASPART 100 UNIT/ML IV SOLN
10.0000 [IU] | Freq: Once | INTRAVENOUS | Status: AC
  Administered 2021-02-15: 10 [IU] via INTRAVENOUS

## 2021-02-15 MED ORDER — SODIUM ZIRCONIUM CYCLOSILICATE 10 G PO PACK
10.0000 g | PACK | Freq: Once | ORAL | Status: AC
  Administered 2021-02-15: 10 g via ORAL
  Filled 2021-02-15: qty 1

## 2021-02-15 MED ORDER — SODIUM CHLORIDE 0.9 % IV SOLN: 100.0000 mL | INTRAVENOUS | Status: DC | PRN

## 2021-02-15 MED ORDER — PENTAFLUOROPROP-TETRAFLUOROETH EX AERO: 1.0000 "application " | INHALATION_SPRAY | CUTANEOUS | Status: DC | PRN

## 2021-02-15 MED ORDER — LIDOCAINE-PRILOCAINE 2.5-2.5 % EX CREA: 1.0000 "application " | TOPICAL_CREAM | CUTANEOUS | Status: DC | PRN

## 2021-02-15 MED ORDER — CALCIUM GLUCONATE-NACL 1-0.675 GM/50ML-% IV SOLN
1.0000 g | Freq: Once | INTRAVENOUS | Status: AC
  Administered 2021-02-15: 1000 mg via INTRAVENOUS
  Filled 2021-02-15: qty 50

## 2021-02-15 MED ORDER — DEXTROSE 50 % IV SOLN
1.0000 | Freq: Once | INTRAVENOUS | Status: AC
  Administered 2021-02-15: 50 mL via INTRAVENOUS
  Filled 2021-02-15: qty 50

## 2021-02-15 MED ORDER — ALTEPLASE 2 MG IJ SOLR: 2.0000 mg | Freq: Once | INTRAMUSCULAR | Status: DC | PRN

## 2021-02-15 MED ORDER — LIDOCAINE HCL (PF) 1 % IJ SOLN: 5.0000 mL | INTRAMUSCULAR | Status: DC | PRN

## 2021-02-15 MED ORDER — FOLIC ACID 1 MG PO TABS
5.0000 mg | ORAL_TABLET | Freq: Every day | ORAL | Status: DC
  Administered 2021-02-15 – 2021-02-20 (×6): 5 mg via ORAL
  Filled 2021-02-15 (×6): qty 5

## 2021-02-15 NOTE — Progress Notes (Addendum)
Nephrology Follow-Up Consult note   Assessment/Recommendations: Emma Patton is a/an 50 y.o. female with a past medical history significant for HTN, DM2, obesity, CKD 3B, admitted for acute renal failure and hyperkalemia.     Non-Oliguric severe AKI: Severe AKI with creatinine up to 7.9 from baseline of 1.5-2.  Is unclear what precipitated her AKI.  She has a history of severe AKI requiring dialysis associated with polypharmacy and hypotension.  It is possible that this occurred again.  Obstruction may have contributed but no obvious hydronephrosis on renal ultrasound.  Her urine is extremely bland arguing against any glomerular disease. -Likely plan for dialysis again tomorrow -Continue to monitor daily Cr, Dose meds for GFR -Monitor Daily I/Os, Daily weight  -Maintain MAP>65 for optimal renal perfusion.  -Avoid nephrotoxic medications including NSAIDs and Vanc/Zosyn combo -Maintain Foley catheter for now  Severe hyperkalemia: Persistent elevation and hyperkalemia despite aggressive medical therapy requiring dialysis today. -Follow labs closely -Consider standing Lokelma if hyperkalemia persist after dialysis -Plan for dialysis again tomorrow  Hypertension: Blood pressure has been low.  Stop amlodipine  Anemia: Hemoglobin 8.3.  Continue to monitor closely.  Transfuse as needed.  Uncontrolled Diabetes Mellitus Type 2 with Hyperglycemia: Management per primary team  Hyperphosphatemia: Associated with renal failure at 9.7 today.  Monitor after dialysis  Hyponatremia: Sodium 131 likely associated with fluid excess in the setting of renal failure.  Continue to monitor   Recommendations conveyed to primary service.    [facility] 02/15/2021 12:51 PM  ___________________________________________________________  CC: AKI on CKD  Interval History/Subjective: Patient was seen on dialysis and tolerating fairly well.  Denies any fevers, chills,  shortness of breath, chest pain.  Persistent hyperkalemia overnight requiring dialysis this morning.   Medications:  Current Facility-Administered Medications  Medication Dose Route Frequency Provider Last Rate Last Admin  . acetaminophen (TYLENOL) tablet 650 mg  650 mg Oral Q6H PRN Aslam, Loralyn Freshwater, MD       Or  . acetaminophen (TYLENOL) suppository 650 mg  650 mg Rectal Q6H PRN Aslam, Sadia, MD      . amLODipine (NORVASC) tablet 5 mg  5 mg Oral Daily Aslam, Sadia, MD   5 mg at 02/15/21 1228  . atorvastatin (LIPITOR) tablet 10 mg  10 mg Oral Daily Aslam, Loralyn Freshwater, MD   10 mg at 02/15/21 1228  . Chlorhexidine Gluconate Cloth 2 % PADS 6 each  6 each Topical Daily Campbell Riches, MD      . Chlorhexidine Gluconate Cloth 2 % PADS 6 each  6 each Topical Q0600 Rosita Fire, MD   6 each at 02/15/21 0630  . heparin injection 5,000 Units  5,000 Units Subcutaneous Q8H Harvie Heck, MD   5,000 Units at 02/14/21 2255  . HYDROmorphone (DILAUDID) tablet 2 mg  2 mg Oral Q3H PRN Harvie Heck, MD   2 mg at 02/14/21 2258  . insulin aspart (novoLOG) injection 0-20 Units  0-20 Units Subcutaneous TID WC Aslam, Sadia, MD      . insulin glargine (LANTUS) injection 10 Units  10 Units Subcutaneous QHS Harvie Heck, MD   10 Units at 02/14/21 2254  . lactated ringers bolus 500 mL  500 mL Intravenous Once Aslam, Sadia, MD      . metoCLOPramide (REGLAN) tablet 10 mg  10 mg Oral TID PRN Aslam, Sadia, MD      . pantoprazole (PROTONIX) EC tablet 40 mg  40 mg Oral BID AC Harvie Heck, MD      .  sodium chloride flush (NS) 0.9 % injection 10-40 mL  10-40 mL Intracatheter PRN Campbell Riches, MD      . sodium chloride flush (NS) 0.9 % injection 3 mL  3 mL Intravenous Q12H Aslam, Loralyn Freshwater, MD   3 mL at 02/14/21 2256  . zolpidem (AMBIEN) tablet 5 mg  5 mg Oral QHS PRN Harvie Heck, MD          Review of Systems: 10 systems reviewed and negative except per interval history/subjective  Physical Exam: Vitals:   02/15/21  1155 02/15/21 1235  BP: 117/60 (!) 132/58  Pulse: 74 80  Resp: 15 16  Temp: 97.9 F (36.6 C) 98.5 F (36.9 C)  SpO2: 98% 98%   Total I/O In: -  Out: 1000 [Other:1000]  Intake/Output Summary (Last 24 hours) at 02/15/2021 1251 Last data filed at 02/15/2021 1155 Gross per 24 hour  Intake 445.85 ml  Output 1600 ml  Net -1154.15 ml   Constitutional: well-appearing, no acute distress ENMT: ears and nose without scars or lesions, MMM CV: normal rate, no edema Respiratory: clear to auscultation, normal work of breathing Gastrointestinal: soft, non-tender, no palpable masses or hernias Skin: no visible lesions or rashes Psych: alert, judgement/insight appropriate, appropriate mood and affect   Test Results I personally reviewed new and old clinical labs and radiology tests Lab Results  Component Value Date   NA 131 (L) 02/15/2021   K >7.5 (HH) 02/15/2021   CL 101 02/15/2021   CO2 22 02/15/2021   BUN 128 (H) 02/15/2021   CREATININE 7.62 (H) 02/15/2021   CALCIUM 7.7 (L) 02/15/2021   ALBUMIN 2.7 (L) 02/15/2021   PHOS 9.7 (H) 02/15/2021

## 2021-02-15 NOTE — Progress Notes (Signed)
Subjective:   Overnight: Patient failed medical management of hyperkalemia. Neurology were consulted who plan for HD this morning after placement of HD cath by PCCM.  Ms. Carrio was about to be taken down for urgent dialysis when I saw her this morning. She said she was doing "okay" and denied any worsening of her lower extremity swelling today or abdominal pain, but continued to appear confused and I was not able to obtain a full history this morning.   Objective:  Vital signs in last 24 hours: Vitals:   02/15/21 1130 02/15/21 1155 02/15/21 1235 02/15/21 1700  BP: (!) 105/50 117/60 (!) 132/58 (!) 149/58  Pulse: 73 74 80 75  Resp:  '15 16 16  '$ Temp:  97.9 F (36.6 C) 98.5 F (36.9 C) 98.5 F (36.9 C)  TempSrc:  Oral Oral Axillary  SpO2:  98% 98% 96%  Weight:  (!) 139.2 kg    Height:       General: Patient is obese and appears chronically ill although in no acute distress. Eyes: Sclera non-icteric. No conjunctival injection.  Respiratory: Lungs are CTA, bilaterally. No wheezes, rales, or rhonchi. Slightly increased work of breathing.  Cardiovascular: Regular rate and rhythm. No murmurs, rubs, or gallops. Bilateral lower extremity pitting edema L > R appears consistent from yesterday (up to 2+ to the knees).  Neurological: Patient is awake and alert although appears to be disoriented. Following simple commands.  MSK: She has severe Charcot deformities of bilateral feet L > R. She has diffusely decreased muscle strength. Abdominal: Soft and non-tender to palpation. No rebound or guarding.  Assessment/Plan:  Active Problems:   Diabetes mellitus type 2 with complications, uncontrolled (HCC)   Acute on chronic renal failure (HCC)   Hyperkalemia   Encounter for central line placement  Acute on chronic renal failure (baseline CKD 3b)  Hyperkalemia Patient was taken for urgent dialysis this morning after failing medical management. She does have a history of severe AKI  requiring dialysis in the past associated with polypharmacy and hypotension so this is also on the differential as well as acute toxicity from Bactrim. Her U/A did not show any evidence of infection or glomerular disease and renal ultrasound appeared normal, helping to rule out obstructive cause. CK was normal, ruling out rhabdo. Potassium remained elevated following dialysis and she was given 10g Lokelma once. - Nephrology following, appreciate their recommendations - Likely plan for dialysis again tomorrow  - Continue to monitor daily renal function, strict I&O's and daily weights  - If potassium remains high following dialysis, consider scheduled Lokelma  - Continue foley catheter for now  - Avoid nephrotoxic agents   Macrocytic anemia B12 and folate deficiencies  Iron Deficiency  B12 was 144 and folate was 5.3. Iron studies revealed iron of 26, iron sat 9%, ferritin 180 and TIBC 298. With low iron sat, patient would likely benefit from IV iron replacement. 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 She does not appear to have an active infection currently and is afebrile without leukocytosis. WOC note that patient has not followed with a podiatrist since 04/2017. WOC recommend the following (will not follow at this time): - Cleanse right plantar foot wound with saline, pat dry.  Cut a piece of Aquacel Kellie Simmering [phone]) and place it into the wound bed, cover with a dry gauze and tape in place, or a small foam dressing. Change daily. - Bactrim added to allergy list  - Consider compression stockings for lower extremity edema (at least over LE without active lesion) - Will require podiatrist on discharge   Diabetes  mellitus Most recent HbA1c 10.9. Patient recently started on insulin pump and notes that her CBG's have been "all over the place". Glucose on labs have been in 100's during this admission.  - Lantus 10U + SSI (resistant) - CBG monitoring closely as her requirements are likely lower with renal failure   Hypertension BP's highly variable due to dialysis.  - Holding antihypertensives for now   Hyperlipidemia: - Continue lipitor '10mg'$  daily   Chronic osteomyelitis of spine Chronic pain syndrome Patient is on percocet 10-'325mg'$  q6h as needed for pain. Given her acute on chronic renal failure, opiate of choice would be hydromorphone at this time until recovery of renal function. - Dilaudid '2mg'$  q3h prn  - Continue to monitor   Diet: Renal/carb modified DVT prophylaxis: Heparin 5000 units every 8 hours Bowel regimen: Reglan / Lokelma CODE STATUS: Full  Prior to Admission Living Arrangement: Home Anticipated Discharge Location: Pending PT/OT evaluation, likely SNF Barriers to Discharge: Acute Renal Failure, Electrolyte Abnormalities   Jeralyn Bennett, MD 02/15/2021, 5:18 PM Pager: [phone] After 5pm on weekdays and 1pm on weekends: On Call pager [phone]

## 2021-02-15 NOTE — Progress Notes (Addendum)
Nephrology update: Failed medical management of hyperkalemia. Plan HD this morning after placement of HD catheter by PCCM. HD ordered and discussed with dialysis nurse.

## 2021-02-15 NOTE — Consult Note (Signed)
[facility] Nurse Consult Note: Patient now in HD, otherwise receiving care in [facility] [id number]. 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 Cut a piece of Aquacel Kellie Simmering [phone]) and place it into the wound bed, cover with a dry gauze and tape in place, or a small foam dressing. Change daily.  I strongly recommend the patient resume her visits to the podiatrist. It appears from the Encounter list she has not seen a podiatrist since 04/2017.  Monitor the wound area(s) for worsening of condition such as: Signs/symptoms of infection,  Increase in size,  Development of or worsening of odor, Development of pain, or increased pain at the affected locations.  Notify the medical team if any of these develop.  Thank you for the consult. [facility] nurse will not follow at this time.  Please re-consult the [facility] team if needed.  Val Riles, RN, MSN, CWOCN, CNS-BC, pager [phone]

## 2021-02-15 NOTE — Progress Notes (Signed)
Notified Nephrology of critical potassium of >7.5 via secure chat.

## 2021-02-15 NOTE — Procedures (Signed)
Central Venous Catheter Insertion Procedure Note  Emma Patton  [id number]  June 28, 1971  Date:02/15/21  Time:8:31 AM   Provider Performing:Pete E Kary Kos   Procedure: Insertion of Non-tunneled Central Venous Catheter([id number])with US guidance [id number])    Indication(s) Hemodialysis  Consent Risks of the procedure as well as the alternatives and risks of each were explained to the patient and/or caregiver.  Consent for the procedure was obtained and is signed in the bedside chart  Anesthesia Topical only with 1% lidocaine   Timeout Verified patient identification, verified procedure, site/side was marked, verified correct patient position, special equipment/implants available, medications/allergies/relevant history reviewed, required imaging and test results available.  Sterile Technique Maximal sterile technique including full sterile barrier drape, hand hygiene, sterile gown, sterile gloves, mask, hair covering, sterile ultrasound probe cover (if used).  Procedure Description Area of catheter insertion was cleaned with chlorhexidine and draped in sterile fashion.   With real-time ultrasound guidance a HD catheter was placed into the right internal jugular vein.  Nonpulsatile blood flow and easy flushing noted in all ports.  The catheter was sutured in place and sterile dressing applied.  Complications/Tolerance None; patient tolerated the procedure well. Chest X-ray is ordered to verify placement for internal jugular or subclavian cannulation.  Chest x-ray is not ordered for femoral cannulation.  EBL Minimal  Specimen(s) None  Erick Colace ACNP-BC [place] Pager # [phone] OR # [phone] if no answer

## 2021-02-15 NOTE — Procedures (Signed)
I was present at this dialysis session. I have reviewed the session itself and made appropriate changes.   Filed Weights   02/14/21 1754 02/15/21 0850 02/15/21 1155  Weight: (!) 140.8 kg (!) 140.2 kg (!) 139.2 kg    Recent Labs  Lab 02/15/21 0635  NA 131*  K >7.5*  CL 101  CO2 22  GLUCOSE 207*  BUN 128*  CREATININE 7.62*  CALCIUM 7.7*  PHOS 9.7*    Recent Labs  Lab 02/14/21 1202 02/14/21 1245 02/15/21 0912  WBC 9.6  --  9.1  NEUTROABS 7.5  --   --   HGB 9.3* 9.2* 8.3*  HCT 31.4* 27.0* 27.4*  MCV 105.0*  --  102.6*  PLT 254  --  333    Scheduled Meds: . atorvastatin  10 mg Oral Daily  . Chlorhexidine Gluconate Cloth  6 each Topical Daily  . Chlorhexidine Gluconate Cloth  6 each Topical Q0600  . heparin  5,000 Units Subcutaneous Q8H  . insulin aspart  0-20 Units Subcutaneous TID WC  . insulin glargine  10 Units Subcutaneous QHS  . pantoprazole  40 mg Oral BID AC  . sodium chloride flush  3 mL Intravenous Q12H   Continuous Infusions: PRN Meds:.acetaminophen **OR** acetaminophen, HYDROmorphone, metoCLOPramide, sodium chloride flush, zolpidem   Santiago Bumpers,  MD 02/15/2021, 12:59 PM

## 2021-02-15 NOTE — Hospital Course (Addendum)
HFU:  - iron, B12, folate  - needs podiatrist for wound  - avoid bactrim  - repeat iron labs outaptient s/p 1 transfusion  - switch outpatient meds to dilaudid   5/24: Patient states she had a rough night last night. Had 3-4 watery bowel movements along with another one this morning. She does have IBS, but says this is a new problem for her. She does not usually wear oxygen at home, but says she is currently struggling with her breathing. Says her swelling has remained stable, usually has swelling in her L leg. Denies cough or sick contacts or nausea. She does reports some lower abdominal pain when she has a bowel movement, "like she is having a baby." Says her foot wound is better, reports less tenderness.

## 2021-02-16 LAB — CBC
HCT: 25 % — ABNORMAL LOW (ref 36.0–46.0)
Hemoglobin: 8 g/dL — ABNORMAL LOW (ref 12.0–15.0)
MCH: 31.1 pg (ref 26.0–34.0)
MCHC: 32 g/dL (ref 30.0–36.0)
MCV: 97.3 fL (ref 80.0–100.0)
Platelets: 296 10*3/uL (ref 150–400)
RBC: 2.57 MIL/uL — ABNORMAL LOW (ref 3.87–5.11)
RDW: 13.7 % (ref 11.5–15.5)
WBC: 8.3 10*3/uL (ref 4.0–10.5)
nRBC: 0 % (ref 0.0–0.2)

## 2021-02-16 LAB — RENAL FUNCTION PANEL
Albumin: 2.5 g/dL — ABNORMAL LOW (ref 3.5–5.0)
Anion gap: 6 (ref 5–15)
BUN: 62 mg/dL — ABNORMAL HIGH (ref 6–20)
CO2: 29 mmol/L (ref 22–32)
Calcium: 7.9 mg/dL — ABNORMAL LOW (ref 8.9–10.3)
Chloride: 101 mmol/L (ref 98–111)
Creatinine, Ser: 4.53 mg/dL — ABNORMAL HIGH (ref 0.44–1.00)
GFR, Estimated: 11 mL/min — ABNORMAL LOW (ref >60)
Glucose, Bld: 109 mg/dL — ABNORMAL HIGH (ref 70–99)
Phosphorus: 4.8 mg/dL — ABNORMAL HIGH (ref 2.5–4.6)
Potassium: 5.4 mmol/L — ABNORMAL HIGH (ref 3.5–5.1)
Sodium: 136 mmol/L (ref 135–145)

## 2021-02-16 LAB — GLUCOSE, CAPILLARY
Glucose-Capillary: 151 mg/dL — ABNORMAL HIGH (ref 70–99)
Glucose-Capillary: 105 mg/dL — ABNORMAL HIGH (ref 70–99)
Glucose-Capillary: 146 mg/dL — ABNORMAL HIGH (ref 70–99)
Glucose-Capillary: 162 mg/dL — ABNORMAL HIGH (ref 70–99)

## 2021-02-16 LAB — VITAMIN B12: Vitamin B-12: 146 pg/mL — ABNORMAL LOW (ref 180–914)

## 2021-02-16 MED ORDER — POLYETHYLENE GLYCOL 3350 17 G PO PACK
17.0000 g | PACK | Freq: Two times a day (BID) | ORAL | Status: DC
  Administered 2021-02-16 (×2): 17 g via ORAL
  Filled 2021-02-16 (×3): qty 1

## 2021-02-16 MED ORDER — GUAIFENESIN 100 MG/5ML PO SOLN: 5.0000 mL | ORAL | Status: DC | PRN

## 2021-02-16 MED ORDER — LINACLOTIDE 145 MCG PO CAPS
145.0000 ug | ORAL_CAPSULE | Freq: Every day | ORAL | Status: DC
  Administered 2021-02-16: 145 ug via ORAL
  Filled 2021-02-16 (×2): qty 1

## 2021-02-16 MED ORDER — SENNOSIDES-DOCUSATE SODIUM 8.6-50 MG PO TABS
2.0000 | ORAL_TABLET | Freq: Two times a day (BID) | ORAL | Status: DC
  Administered 2021-02-16 (×2): 2 via ORAL
  Filled 2021-02-16 (×3): qty 2

## 2021-02-16 MED ORDER — ONDANSETRON HCL 4 MG/2ML IJ SOLN
4.0000 mg | Freq: Once | INTRAMUSCULAR | Status: AC
  Administered 2021-02-16: 4 mg via INTRAVENOUS
  Filled 2021-02-16: qty 2

## 2021-02-16 NOTE — Progress Notes (Signed)
PT Cancellation Note  Patient Details Name: Emma Patton MRN: [id number] DOB: 04-04-1971   Cancelled Treatment:    Reason Eval/Treat Not Completed: (P) Patient at procedure or test/unavailable Pt is off floor for HD. PT will follow back this afternoon for Evaluation as able.  Emma Patton B. Migdalia Dk PT, DPT Acute Rehabilitation Services Pager [phone] Office [phone]    [place] 02/16/2021, 9:51 AM

## 2021-02-16 NOTE — Progress Notes (Signed)
Nephrology Follow-Up Consult note   Assessment/Recommendations: Emma Patton is a/an 50 y.o. female with a past medical history significant for HTN, DM2, obesity, CKD 3B, admitted for acute renal failure and hyperkalemia.     Non-Oliguric severe AKI: Severe AKI with creatinine up to 7.9 from baseline of 1.5-2.  Is unclear what precipitated her AKI but likely combination of medications including Bactrim, Lasix, ARB along with possible hypotensive episodes.  Similar episode in the past thought to be related to the same thing. Obstruction may have contributed but no obvious hydronephrosis on renal ultrasound.  Her urine is extremely bland arguing against any glomerular disease.  Urine output is improving. -Short session of dialysis again today to help with hyperkalemia -Hold dialysis starting tomorrow and monitor for renal recovery -Continue to monitor daily Cr, Dose meds for GFR -Monitor Daily I/Os, Daily weight  -Maintain MAP>65 for optimal renal perfusion.  -Avoid nephrotoxic medications including NSAIDs and Vanc/Zosyn combo -Maintain Foley catheter for now  Severe hyperkalemia: Improved with dialysis and Lokelma.  Short session of dialysis again today.  Continue to monitor levels daily for now  Hypertension: Some low blood pressure since admission.  Amlodipine stopped  Anemia: Hemoglobin 8..  Continue to monitor closely.  Transfuse as needed.  Uncontrolled Diabetes Mellitus Type 2 with Hyperglycemia: Management per primary team  Hyperphosphatemia: Associated with renal failure.  Significantly improved to 4.8 today.  Hyponatremia: Resolved with dialysis.   Recommendations conveyed to primary service.    [facility] 02/16/2021 9:39 AM  ___________________________________________________________  CC: AKI on CKD  Interval History/Subjective: Patient states she had a lot of nausea last night.  Tolerated dialysis yesterday.  Making more urine  today around 1 L.  No other complaints.   Medications:  Current Facility-Administered Medications  Medication Dose Route Frequency Provider Last Rate Last Admin  . acetaminophen (TYLENOL) tablet 650 mg  650 mg Oral Q6H PRN Aslam, Loralyn Freshwater, MD       Or  . acetaminophen (TYLENOL) suppository 650 mg  650 mg Rectal Q6H PRN Aslam, Sadia, MD      . atorvastatin (LIPITOR) tablet 10 mg  10 mg Oral Daily Aslam, Sadia, MD   10 mg at 02/15/21 1228  . Chlorhexidine Gluconate Cloth 2 % PADS 6 each  6 each Topical Daily Campbell Riches, MD      . Chlorhexidine Gluconate Cloth 2 % PADS 6 each  6 each Topical Q0600 Rosita Fire, MD   6 each at 02/16/21 0554  . folic acid (FOLVITE) tablet 5 mg  5 mg Oral Daily Jeralyn Bennett, MD   5 mg at 02/15/21 1728  . heparin injection 5,000 Units  5,000 Units Subcutaneous Q8H Harvie Heck, MD   5,000 Units at 02/16/21 0554  . HYDROmorphone (DILAUDID) tablet 2 mg  2 mg Oral Q3H PRN Harvie Heck, MD   2 mg at 02/16/21 0109  . insulin aspart (novoLOG) injection 0-20 Units  0-20 Units Subcutaneous TID WC Harvie Heck, MD   4 Units at 02/16/21 0820  . insulin glargine (LANTUS) injection 10 Units  10 Units Subcutaneous QHS Harvie Heck, MD   10 Units at 02/15/21 2119  . linaclotide (LINZESS) capsule 145 mcg  145 mcg Oral QAC breakfast Harvie Heck, MD   145 mcg at 02/16/21 0831  . metoCLOPramide (REGLAN) tablet 10 mg  10 mg Oral TID PRN Harvie Heck, MD   10 mg at 02/16/21 0742  . pantoprazole (PROTONIX) EC tablet 40 mg  40  mg Oral BID AC Aslam, Loralyn Freshwater, MD   40 mg at 02/15/21 1728  . polyethylene glycol (MIRALAX / GLYCOLAX) packet 17 g  17 g Oral BID Mitzi Hansen, MD   17 g at 02/16/21 0208  . senna-docusate (Senokot-S) tablet 2 tablet  2 tablet Oral BID Mitzi Hansen, MD   2 tablet at 02/16/21 0208  . sodium chloride flush (NS) 0.9 % injection 10-40 mL  10-40 mL Intracatheter PRN Campbell Riches, MD      . sodium chloride flush (NS) 0.9 % injection 3 mL  3  mL Intravenous Q12H Aslam, Sadia, MD   3 mL at 02/15/21 2238  . zolpidem (AMBIEN) tablet 5 mg  5 mg Oral QHS PRN Harvie Heck, MD          Review of Systems: 10 systems reviewed and negative except per interval history/subjective  Physical Exam: Vitals:   02/16/21 0347 02/16/21 0810  BP: (!) 141/52 (!) 125/55  Pulse: 69 70  Resp: 16 18  Temp: 98.4 F (36.9 C) 98.4 F (36.9 C)  SpO2: 98% 93%   No intake/output data recorded.  Intake/Output Summary (Last 24 hours) at 02/16/2021 [id number] Last data filed at 02/16/2021 0015 Gross per 24 hour  Intake --  Output 2100 ml  Net -2100 ml   Constitutional: well-appearing, no acute distress ENMT: ears and nose without scars or lesions, MMM CV: normal rate Respiratory: Bilateral chest rise, normal work of breathing Gastrointestinal: soft, non-tender, no palpable masses or hernias Skin: no visible lesions or rashes Psych: alert, judgement/insight appropriate, appropriate mood and affect   Test Results I personally reviewed new and old clinical labs and radiology tests Lab Results  Component Value Date   NA 136 02/16/2021   K 5.4 (H) 02/16/2021   CL 101 02/16/2021   CO2 29 02/16/2021   BUN 62 (H) 02/16/2021   CREATININE 4.53 (H) 02/16/2021   CALCIUM 7.9 (L) 02/16/2021   ALBUMIN 2.5 (L) 02/16/2021   PHOS 4.8 (H) 02/16/2021

## 2021-02-16 NOTE — Progress Notes (Signed)
Subjective:  Emma Patton  Overnight, no acute events reported.  This morning, Ms Batcho was evaluated at bedside. She reports feeling nauseous overnight with some lower abdominal discomfort. She notes she has not had a bowel movement in several days and would like something to help with her bowels. Decreased appetite. She reports feeling congested with some productive sputum. Discussed plans for dialysis today.   Objective:  Vital signs in last 24 hours: Vitals:   02/15/21 1932 02/15/21 2332 02/16/21 0347 02/16/21 0810  BP: 134/62 (!) 134/50 (!) 141/52 (!) 125/55  Pulse: 68 69 69 70  Resp: '16 17 16 18  '$ Temp: 97.7 F (36.5 C) 98.8 F (37.1 C) 98.4 F (36.9 C) 98.4 F (36.9 C)  TempSrc: Oral Oral Oral Oral  SpO2: 93% 94% 98% 93%  Weight:      Height:       Physical Exam: General: Laying in bed in no acute distress CV: Regular rate, rhythm. No murmurs, rubs, gallops appreciated. Pulm: Normal work of breathing on supplemental oxygen.  Abdomen: Soft, non-distended, non-tender. Normoactive bowel sounds. Neuro: Awake, alert, answering questions appropriately.  CBC Latest Ref Rng & Units 02/16/2021 02/15/2021 02/14/2021  WBC 4.0 - 10.5 K/uL 8.3 9.1 -  Hemoglobin 12.0 - 15.0 g/dL 8.0(L) 8.3(L) 9.2(L)  Hematocrit 36.0 - 46.0 % 25.0(L) 27.4(L) 27.0(L)  Platelets 150 - 400 K/uL 296 333 -   BMP Latest Ref Rng & Units 02/16/2021 02/15/2021 02/15/2021  Glucose 70 - 99 mg/dL 109(H) 110(H) 207(H)  BUN 6 - 20 mg/dL 62(H) 61(H) 128(H)  Creatinine 0.44 - 1.00 mg/dL 4.53(H) 4.66(H) 7.62(H)  Sodium 135 - 145 mmol/L 136 134(L) 131(L)  Potassium 3.5 - 5.1 mmol/L 5.4(H) 5.4(H) >7.5(HH)  Chloride 98 - 111 mmol/L 101 99 101  CO2 22 - 32 mmol/L '29 28 22  '$ Calcium 8.9 - 10.3 mg/dL 7.9(L) 7.8(L) 7.7(L)   Assessment/Plan: [name] is 50yo person living with class III obesity, uncontrolled type 2 diabetes mellitus, osteomyelitis, hypertension, paroxysmal atrial fibrillation admitted 5/19  with acute on chronic renal failure and severe hyperkalemia.  Active Problems:   Diabetes mellitus type 2 with complications, uncontrolled (HCC)   Acute on chronic renal failure (HCC)   Hyperkalemia   Encounter for central line placement  #Acute on chronic renal failure #Hyperkalemia Most likely acute on chronic renal failure 2/2 poor po intake and recent Bactrim use. Serum creatinine 7.9 on arrival w/ K 7.7. Patient initially failed medical management, requiring HD yesterday. Since then she has remained afebrile, hemodynamically stable. Nephrology following, appreciate recommendations. Patient to undergo dialysis again today.  - Per nephrology hold HD tomorrow - Daily BMP - Strict I/Os - Daily weights - Avoid nephrotoxins  #Chronic anemia 2/2 vitamin B12, folate, and iron deficiences Hgb 8.0<8.3, no signs of bleeding on exam. Will need to re-start home [id number] and folic acid supplementation upon discahrge. Can consider IV iron as renal function improves. - Daily CBC - Transfuse Hgb <7 - Consider IV iron  #Type 2 diabetes mellitus CBG's remaining at goal <180. No changes in regimen. - Lantus 10U QHS - SSI - CBG monitoring  #Constipation Patient reports she has not had bowel movement in several days and is experiencing intermittent nausea. At home she takes Linzess. Will re-start this, hopeful she will have BM today. - Linzess 145 mcg qd - Miralax BID - Senokot-S 2 tablets BID - Reglan '10mg'$  q3h PRN   #Chronic pain syndrome Pain well-controlled this morning. Will continue with dilaudid as needed given  renal function. - PO dilaudid '2mg'$  q3h PRN  #Chronic bilateral lower extremity edema #R foot ulceration Appreciate WOCN assistance and recommendations. - Care per WOCN  DIET: CM IVF: n/a DVT PPX: Heparin BOWEL:  CODE: FAM COM:  Prior to Admission Living Arrangement: Home Anticipated Discharge Location: pending PT/OT Barriers to Discharge: medical management Dispo:  Anticipated discharge in approximately >2 day(s).   Patton Dame, MD 02/16/2021, 8:25 AM Pager: [phone] After 5pm on weekdays and 1pm on weekends: On Call pager [phone]

## 2021-02-16 NOTE — Evaluation (Signed)
Physical Therapy Evaluation Patient Details Name: Emma Patton MRN: [id number] DOB: 1970-10-03 Today's Date: 02/16/2021   History of Present Illness  50 y.o. female  transfer from [place] with ARF after 1 week of variable Glc on her monitor and 3 days of decreased po. Admitted to [facility]/14/21 for emergent HD for treatment of acute renal failure and hyperkalemia.with a past medical history significant for HTN, DM2, obesity, CKD 3B,  Clinical Impression  PTA pt living with husband in double wide trailer with 3 steps to enter. Pt reports ambulation household distance with RW. Husband assists with bADLs and does all iADLs. Pt currently limited in safe mobility by increased O2 demand in presence of generalized weakness and decreased endurance. Pt is currently min A for bed mobility, transfers and lateral stepping along side of bed with HHA. PT recommending HHPT at discharge to improve strength and endurance to safely navigate in her home environment. PT will continue to follow acutely.     Follow Up Recommendations Home health PT;Supervision for mobility/OOB    Equipment Recommendations  Other (comment) (has necessary equipment)    Recommendations for Other Services OT consult     Precautions / Restrictions Precautions Precautions: Fall Restrictions Weight Bearing Restrictions: No      Mobility  Bed Mobility Overal bed mobility: Needs Assistance Bed Mobility: Supine to Sit;Rolling Rolling: Modified independent (Device/Increase time)   Supine to sit: Min assist     General bed mobility comments: mod I for rolling off of bed pan and min A for bringing trunk to upright to sit EoB    Transfers Overall transfer level: Needs assistance Equipment used: None Transfers: Sit to/from Stand Sit to Stand: Min guard         General transfer comment: min guard for safety, good power up and self steadying  Ambulation/Gait Ambulation/Gait assistance: Min assist Gait Distance  (Feet): 3 Feet Assistive device: 1 person hand held assist Gait Pattern/deviations: Step-to pattern;Decreased step length - right;Decreased step length - left;Shuffle;Trunk flexed;Wide base of support Gait velocity: slowed Gait velocity interpretation: <1.31 ft/sec, indicative of household ambulator General Gait Details: min A for slow, shuffling steps towards HoB        Balance Overall balance assessment: Needs assistance Sitting-balance support: Feet supported;No upper extremity supported Sitting balance-Leahy Scale: Fair Sitting balance - Comments: refused to get to recliner however agreeable to sit EoB   Standing balance support: Bilateral upper extremity supported Standing balance-Leahy Scale: Poor Standing balance comment: requires UE support                             Pertinent Vitals/Pain Pain Assessment: 0-10 Pain Score: 3  Pain Location: L LE in dependent position Pain Descriptors / Indicators: Pins and needles;Guarding Pain Intervention(s): Limited activity within patient's tolerance;Monitored during session;Repositioned    Home Living Family/patient expects to be discharged to:: Private residence Living Arrangements: Spouse/significant other Available Help at Discharge: Family;Available 24 hours/day Type of Home: Mobile home Home Access: Stairs to enter Entrance Stairs-Rails: Right Entrance Stairs-Number of Steps: 3 Home Layout: One level Home Equipment: Walker - 2 wheels;Bedside commode      Prior Function Level of Independence: Needs assistance   Gait / Transfers Assistance Needed: Walks with RW; sometimes needs assist from spouse for bed mobility  ADL's / Homemaking Assistance Needed: has LB ADLs, but husband does it most of the time        Hand Dominance   Dominant Hand: Right  Extremity/Trunk Assessment   Upper Extremity Assessment Upper Extremity Assessment: Defer to OT evaluation    Lower Extremity Assessment Lower  Extremity Assessment: LLE deficits/detail;RLE deficits/detail RLE Deficits / Details: decreased ROM due to body habitus strength grossly 3/5 LLE Deficits / Details: decreased ROM due to body habitus strength grossly 3/5 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 LLE Sensation: history of peripheral neuropathy       Communication   Communication: No difficulties  Cognition Arousal/Alertness: Awake/alert Behavior During Therapy: WFL for tasks assessed/performed Overall Cognitive Status: Within Functional Limits for tasks assessed                                        General Comments General comments (skin integrity, edema, etc.): VSS on 4L O2 vIA [place] 96%o2    Exercises General Exercises - Lower Extremity Ankle Circles/Pumps: AROM;Both;10 reps Long Arc Quad: AROM;Both;10 reps Hip Flexion/Marching: AROM;Both;10 reps;Seated   Assessment/Plan    PT Assessment Patient needs continued PT services  PT Problem List Decreased strength;Decreased range of motion;Decreased activity tolerance;Decreased balance;Decreased mobility;Decreased safety awareness;Cardiopulmonary status limiting activity;Obesity;Pain       PT Treatment Interventions DME instruction;Gait training;Stair training;Functional mobility training;Therapeutic activities;Therapeutic exercise;Balance training;Cognitive remediation;Patient/family education    PT Goals (Current goals can be found in the Care Plan section)  Acute Rehab PT Goals Patient Stated Goal: be more independent PT Goal Formulation: With patient Time For Goal Achievement: 03/02/21 Potential to Achieve Goals: Fair    Frequency Min 3X/week    AM-PAC PT "6 Clicks" Mobility  Outcome Measure Help needed turning from your back to your side while in a flat bed without using bedrails?: None Help needed moving from lying on your back to sitting on the side of a flat bed without using bedrails?: None Help needed moving to and from a bed to a chair (including a  wheelchair)?: A Little Help needed standing up from a chair using your arms (e.g., wheelchair or bedside chair)?: A Little Help needed to walk in hospital room?: A Lot Help needed climbing 3-5 steps with a railing? : A Lot 6 Click Score: 18    End of Session Equipment Utilized During Treatment: Oxygen Activity Tolerance: Patient tolerated treatment well Patient left: in bed;with call bell/phone within reach;with bed alarm set Nurse Communication: Mobility status PT Visit Diagnosis: Unsteadiness on feet (R26.81);Other abnormalities of gait and mobility (R26.89);Muscle weakness (generalized) (M62.81);Difficulty in walking, not elsewhere classified (R26.2);Pain Pain - Right/Left: Left Pain - part of body: Leg    Time: [id number] PT Time Calculation (min) (ACUTE ONLY): 34 min   Charges:   PT Evaluation $PT Eval Moderate Complexity: 1 Mod PT Treatments $Therapeutic Activity: 8-22 mins        Alanys Godino B. Migdalia Dk PT, DPT Acute Rehabilitation Services Pager [phone] Office [phone]   [place] 02/16/2021, 4:49 PM

## 2021-02-16 NOTE — Progress Notes (Signed)
OT Cancellation Note  Patient Details Name: Emma Patton MRN: [id number] DOB: 07-27-1971  Cancellation Treatment: Reason Eval/Treat Not Completed: Patient at procedure or test/unavailable. Pt is off floor for HD. OT will follow back this afternoon for OT Eval as able.  Thanks, Michel Bickers, OTR/L [facility] Acute Rehab Services [phone]

## 2021-02-17 LAB — RENAL FUNCTION PANEL
Albumin: 2.4 g/dL — ABNORMAL LOW (ref 3.5–5.0)
Anion gap: 6 (ref 5–15)
BUN: 46 mg/dL — ABNORMAL HIGH (ref 6–20)
CO2: 29 mmol/L (ref 22–32)
Calcium: 7.9 mg/dL — ABNORMAL LOW (ref 8.9–10.3)
Chloride: 102 mmol/L (ref 98–111)
Creatinine, Ser: 3.18 mg/dL — ABNORMAL HIGH (ref 0.44–1.00)
GFR, Estimated: 17 mL/min — ABNORMAL LOW (ref >60)
Glucose, Bld: 115 mg/dL — ABNORMAL HIGH (ref 70–99)
Phosphorus: 4 mg/dL (ref 2.5–4.6)
Potassium: 4.7 mmol/L (ref 3.5–5.1)
Sodium: 137 mmol/L (ref 135–145)

## 2021-02-17 LAB — VITAMIN B12: Vitamin B-12: 143 pg/mL — ABNORMAL LOW (ref 180–914)

## 2021-02-17 LAB — GLUCOSE, CAPILLARY
Glucose-Capillary: 94 mg/dL (ref 70–99)
Glucose-Capillary: 150 mg/dL — ABNORMAL HIGH (ref 70–99)
Glucose-Capillary: 142 mg/dL — ABNORMAL HIGH (ref 70–99)
Glucose-Capillary: 147 mg/dL — ABNORMAL HIGH (ref 70–99)

## 2021-02-17 MED ORDER — ENOXAPARIN SODIUM 40 MG/0.4ML IJ SOSY
40.0000 mg | PREFILLED_SYRINGE | INTRAMUSCULAR | Status: DC
  Administered 2021-02-18: 40 mg via SUBCUTANEOUS
  Filled 2021-02-17: qty 0.4

## 2021-02-17 MED ORDER — LINACLOTIDE 145 MCG PO CAPS: 145.0000 ug | ORAL_CAPSULE | Freq: Every day | ORAL | Status: DC | PRN

## 2021-02-17 MED ORDER — PREGABALIN 75 MG PO CAPS
75.0000 mg | ORAL_CAPSULE | Freq: Three times a day (TID) | ORAL | Status: DC
  Administered 2021-02-17 – 2021-02-19 (×7): 75 mg via ORAL
  Filled 2021-02-17 (×7): qty 1

## 2021-02-17 NOTE — Progress Notes (Signed)
Subjective: HD #3 Overnight, no acute events reported.  This morning, [name] was evaluated at bedside. She is sitting up in bedside chair. She reports feeling better, has good appetite and has been eating more of her meals. She notes some bilateral lower extremity neuropathy and is requesting her home medications.   Objective:  Vital signs in last 24 hours: Vitals:   02/16/21 2341 02/17/21 0347 02/17/21 0751 02/17/21 1217  BP: (!) 147/63 (!) 151/68 (!) 143/63 (!) 144/62  Pulse: 60 67 63 (!) 53  Resp: '17 17 18 18  '$ Temp: 97.6 F (36.4 C) 98.3 F (36.8 C) 97.8 F (36.6 C) 97.7 F (36.5 C)  TempSrc: Oral Oral Oral Oral  SpO2: 99% 99% 99%   Weight:  (!) 177.8 kg    Height:       CMP Latest Ref Rng & Units 02/17/2021 02/16/2021 02/15/2021  Glucose 70 - 99 mg/dL 115(H) 109(H) 110(H)  BUN 6 - 20 mg/dL 46(H) 62(H) 61(H)  Creatinine 0.44 - 1.00 mg/dL 3.18(H) 4.53(H) 4.66(H)  Sodium 135 - 145 mmol/L 137 136 134(L)  Potassium 3.5 - 5.1 mmol/L 4.7 5.4(H) 5.4(H)  Chloride 98 - 111 mmol/L 102 101 99  CO2 22 - 32 mmol/L '29 29 28  '$ Calcium 8.9 - 10.3 mg/dL 7.9(L) 7.9(L) 7.8(L)  Total Protein 6.5 - 8.1 g/dL - - -  Total Bilirubin 0.3 - 1.2 mg/dL - - -  Alkaline Phos 38 - 126 U/L - - -  AST 15 - 41 U/L - - -  ALT 0 - 44 U/L - - -   CBC Latest Ref Rng & Units 02/16/2021 02/15/2021 02/14/2021  WBC 4.0 - 10.5 K/uL 8.3 9.1 -  Hemoglobin 12.0 - 15.0 g/dL 8.0(L) 8.3(L) 9.2(L)  Hematocrit 36.0 - 46.0 % 25.0(L) 27.4(L) 27.0(L)  Platelets 150 - 400 K/uL 296 333 -   Physical Exam  Constitutional: Severely obese, sitting in bedside chair No distress.  HENT: Normocephalic and atraumatic, EOMI, moist mucous membranes Cardiovascular: Normal rate, regular rhythm, S1 and S2 present, no murmurs, rubs, gallops.  Distal pulses intact Respiratory: No respiratory distress,  Lungs are clear to auscultation bilaterally. Maintaining saturations on 2L O2 GI: Nondistended, soft, nontender to  palpation, active bowel sounds Musculoskeletal: Normal bulk and tone.  No peripheral edema noted. Neurological: Is alert and oriented x4, no apparent focal deficits noted.  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    Acute on chronic renal failure Hyperkalemia, resolved Renal function continues to improve with sCr 3.18<4.53 and improving GFR. Patient has had good urine output with ~1L UOP over night and ~1.4L out thus far. Suspect her weights are inaccurate at this time. She remains slightly hypervolemic on exam but otherwise without any signs/symptoms of uremia. Hyperkalemia is resolved. - Nephrology consulted, appreciate recommendations and assistance - Continue to trend renal function - If renal function improving, can consider discontinuation of HD catheter  - Strict I&O, daily weights  - Avoid nephrotoxic agents and renally adjust medication doses  Chronic anemia due to vitamin B12, folate and iron deficiencies Hb stable this morning. No signs  of bleeding on exam.  - Folic acid '5mg'$  daily  - Weekly vitamin B12 supplementation as outpatient - IV iron tomorrow if renal function improved  - CBC daily, transfuse for Hb<7   Type II DM:  CBG's remain at goal <180 on current regimen. Suspect may need to increase her Lantus dosing as she starts having increased intake.  - Lantus 10U qHS + SSI -  CBG monitoring   Chronic pain syndrome Patient is requesting resumption of her lyrica for her neuropathic pain. Chronic pain is otherwise under control with her  current regimen. Will consider transitioning to home pain regimen once renal function improved.  - Continue oral dilaudid '2mg'$  every 3 hrs prn  - Lyrica '75mg'$  tid   Chronic bilateral lower extremity edema R foot ulceration Has 1+ pitting edema of bilateral lower extremities (L>R) that is improved from yesterday. This should improve as she continues to auto-diurese.  - Continue to monitor - Wound care of R foot ulcer per [facility] RN recommendations   Diet: CM Fluids: None DVT Prophylaxis: Will change to Lovenox now that CrCl>30 ml/min Bowel regimen: Miralax, Senokot, Linzess Code status: FULL  Prior to Admission Living Arrangement: Home  Anticipated Discharge Location: Home w/HH  Barriers to Discharge: Continued medical management  Dispo: Anticipated discharge in approximately 1-2 day(s).   Harvie Heck, MD  [facility] PGY-2 02/17/2021, 3:48 PM Pager:[phone] After 5pm on weekdays and 1pm on weekends: On Call pager [phone]

## 2021-02-17 NOTE — Evaluation (Signed)
Occupational Therapy Evaluation Patient Details Name: Emma Patton MRN: [id number] DOB: March 25, 1971 Today's Date: 02/17/2021    History of Present Illness 50 y.o. female  transfer from [place] with ARF after 1 week of variable Glc on her monitor and 3 days of decreased po. Admitted to [facility] 02/14/21 for emergent HD for treatment of acute renal failure and hyperkalemia.with a past medical history significant for HTN, DM2, obesity, CKD 3B,   Clinical Impression   PTA patient was living with her spouse in a double wide mobile home and was requiring some assist with ADLs. Patient reports ambulating household distances with use of RW. Patient states that her husband is also disabled but assists with ADLs and IADLs as best as he can. Patient currently functioning below baseline requiring Min to Mod A grossly for ADLs and functional transfers with use of RW. Patient also limited by deficits listed below including generalized weakness, nerve pain limiting functional mobility, and decreased activity tolerance and would benefit from continued acute OT services in prep for safe d/c home.     Follow Up Recommendations  Home health OT;Supervision/Assistance - 24 hour    Equipment Recommendations  Other (comment) (TBD)    Recommendations for Other Services       Precautions / Restrictions Precautions Precautions: Fall Restrictions Weight Bearing Restrictions: No      Mobility Bed Mobility Overal bed mobility: Needs Assistance Bed Mobility: Supine to Sit;Sit to Supine     Supine to sit: Min assist Sit to supine: Min assist   General bed mobility comments: Min A for supine <> EOB at trunk. Able to advance BLE toward EOB and advance BLE from EOB to bed surface without external assist.    Transfers Overall transfer level: Needs assistance Equipment used: None Transfers: Sit to/from Stand Sit to Stand: Min guard         General transfer comment: Min guard for safety and line  management.    Balance Overall balance assessment: Needs assistance Sitting-balance support: Feet supported;No upper extremity supported Sitting balance-Leahy Scale: Fair Sitting balance - Comments: Patient declined transfer to recliner.   Standing balance support: Bilateral upper extremity supported Standing balance-Leahy Scale: Poor Standing balance comment: Reliant on BUE on RW.                           ADL either performed or assessed with clinical judgement   ADL Overall ADL's : Needs assistance/impaired Eating/Feeding: Set up;Bed level   Grooming: Set up;Sitting               Lower Body Dressing: Maximal assistance Lower Body Dressing Details (indicate cue type and reason): Max A to don footwear.               General ADL Comments: Patient greatly limited by LLE nerve pain limiting progression of functional mobility.     Vision Baseline Vision/History: Wears glasses Patient Visual Report: No change from baseline       Perception     Praxis      Pertinent Vitals/Pain Pain Assessment: 0-10 Pain Score: 7  Pain Location: LLE at rest worsening with movement (nerve related) Pain Descriptors / Indicators: Pins and needles;Guarding     Hand Dominance Right   Extremity/Trunk Assessment Upper Extremity Assessment Upper Extremity Assessment: Generalized weakness           Communication Communication Communication: No difficulties   Cognition Arousal/Alertness: Awake/alert Behavior During Therapy: WFL for tasks assessed/performed Overall  Cognitive Status: Within Functional Limits for tasks assessed                                     General Comments  VSS on 4L O2 via [place]. SpO2 >94% with light activity.    Exercises     Shoulder Instructions      Home Living Family/patient expects to be discharged to:: Private residence Living Arrangements: Spouse/significant other Available Help at Discharge: Family;Available 24  hours/day Type of Home: Mobile home Home Access: Stairs to enter Entrance Stairs-Number of Steps: 3 Entrance Stairs-Rails: Right Home Layout: One level     Bathroom Shower/Tub: Occupational psychologist: Standard Bathroom Accessibility: Yes   Home Equipment: Environmental consultant - 2 wheels;Bedside commode          Prior Functioning/Environment Level of Independence: Needs assistance  Gait / Transfers Assistance Needed: Walks with RW; sometimes needs assist from spouse for bed mobility ADL's / Homemaking Assistance Needed: Occasional assist from husband.   Comments: Patient reports that husband is also disabled but assist as best he can.        OT Problem List: Decreased strength;Decreased activity tolerance;Impaired balance (sitting and/or standing);Decreased knowledge of use of DME or AE;Impaired sensation;Pain      OT Treatment/Interventions: Self-care/ADL training;Therapeutic exercise;DME and/or AE instruction;Therapeutic activities;Patient/family education;Balance training    OT Goals(Current goals can be found in the care plan section) Acute Rehab OT Goals Patient Stated Goal: be more independent OT Goal Formulation: With patient Time For Goal Achievement: 03/03/21 Potential to Achieve Goals: Good ADL Goals Pt Will Perform Grooming: with modified independence;sitting Pt Will Perform Upper Body Dressing: with modified independence;sitting Pt Will Perform Lower Body Dressing: with modified independence;with adaptive equipment;sit to/from stand Pt Will Transfer to Toilet: with modified independence;ambulating Pt Will Perform Toileting - Clothing Manipulation and hygiene: with modified independence;with adaptive equipment;sit to/from stand Pt/caregiver will Perform Home Exercise Program: Increased strength;Both right and left upper extremity;With written HEP provided Additional ADL Goal #1: Patient will recall 3 energy conservation techniques in prep for ADLs.  OT Frequency:  Min 2X/week   Barriers to D/C:            Co-evaluation              AM-PAC OT "6 Clicks" Daily Activity     Outcome Measure Help from another person eating meals?: None Help from another person taking care of personal grooming?: A Little Help from another person toileting, which includes using toliet, bedpan, or urinal?: A Lot Help from another person bathing (including washing, rinsing, drying)?: A Lot Help from another person to put on and taking off regular upper body clothing?: A Little Help from another person to put on and taking off regular lower body clothing?: A Lot 6 Click Score: 16   End of Session Equipment Utilized During Treatment: Gait belt;Rolling walker;Oxygen Nurse Communication: Mobility status  Activity Tolerance: Patient limited by pain Patient left: in bed;with call bell/phone within reach  OT Visit Diagnosis: Unsteadiness on feet (R26.81);Other abnormalities of gait and mobility (R26.89);Muscle weakness (generalized) (M62.81)                Time: [id number] OT Time Calculation (min): 19 min Charges:  OT General Charges $OT Visit: 1 Visit OT Evaluation $OT Eval Moderate Complexity: 1 Mod  Jair Lindblad H. OTR/L Supplemental OT, Department of rehab services [phone]  Velvie Thomaston R H. 02/17/2021, 2:48 PM

## 2021-02-17 NOTE — Progress Notes (Signed)
Nephrology Follow-Up Consult note   Assessment/Recommendations: Emma Patton is a/an 50 y.o. female with a past medical history significant for HTN, DM2, obesity, CKD 3B, admitted for acute renal failure and hyperkalemia.     Non-Oliguric severe AKI on ckd: Severe AKI with creatinine up to 7.9 from baseline of 1.5-2.  Is unclear what precipitated her AKI but likely combination of medications including Bactrim, Lasix, ARB along with possible hypotensive episodes.  Similar episode in the past thought to be related to the same thing. Obstruction may have contributed but no obvious hydronephrosis on renal ultrasound.  Her urine is extremely bland arguing against any glomerular disease.  Urine output is improving. -No dialysis today and monitor for renal recovery -Remove dialysis once creatinine is improving on its own -Continue to monitor daily Cr, Dose meds for GFR -Monitor Daily I/Os, Daily weight  -Maintain MAP>65 for optimal renal perfusion.  -Avoid nephrotoxic medications including NSAIDs and Vanc/Zosyn combo -Maintain Foley catheter for now  Severe hyperkalemia: Significantly improved with medical therapy and dialysis.  Continue to monitor  Hypertension: Some low blood pressure since admission.  Amlodipine stopped  Anemia: Hemoglobin 8..  Continue to monitor closely.  Transfuse as needed.  Uncontrolled Diabetes Mellitus Type 2 with Hyperglycemia: Management per primary team  Hyperphosphatemia: Associated with renal failure.  Now resolved.  Hyponatremia: Resolved with dialysis.   Recommendations conveyed to primary service.    [facility] 02/17/2021 9:51 AM  ___________________________________________________________  CC: AKI on CKD  Interval History/Subjective: Patient feeling much better today with no nausea.  Fairly good urine output over the past 24 hours with about 1 L   Medications:  Current Facility-Administered Medications   Medication Dose Route Frequency Provider Last Rate Last Admin  . acetaminophen (TYLENOL) tablet 650 mg  650 mg Oral Q6H PRN Aslam, Loralyn Freshwater, MD       Or  . acetaminophen (TYLENOL) suppository 650 mg  650 mg Rectal Q6H PRN Aslam, Sadia, MD      . atorvastatin (LIPITOR) tablet 10 mg  10 mg Oral Daily Aslam, Sadia, MD   10 mg at 02/17/21 0921  . Chlorhexidine Gluconate Cloth 2 % PADS 6 each  6 each Topical Daily Campbell Riches, MD   6 each at 02/17/21 [phone]  . Chlorhexidine Gluconate Cloth 2 % PADS 6 each  6 each Topical Q0600 Rosita Fire, MD   6 each at 02/17/21 [phone]  . folic acid (FOLVITE) tablet 5 mg  5 mg Oral Daily Jeralyn Bennett, MD   5 mg at 02/17/21 0920  . guaiFENesin (ROBITUSSIN) 100 MG/5ML solution 100 mg  5 mL Oral Q4H PRN Sanjuan Dame, MD      . heparin injection 5,000 Units  5,000 Units Subcutaneous Q8H Harvie Heck, MD   5,000 Units at 02/17/21 0644  . HYDROmorphone (DILAUDID) tablet 2 mg  2 mg Oral Q3H PRN Harvie Heck, MD   2 mg at 02/16/21 2049  . insulin aspart (novoLOG) injection 0-20 Units  0-20 Units Subcutaneous TID WC Harvie Heck, MD   3 Units at 02/16/21 1656  . insulin glargine (LANTUS) injection 10 Units  10 Units Subcutaneous QHS Harvie Heck, MD   10 Units at 02/16/21 2050  . linaclotide (LINZESS) capsule 145 mcg  145 mcg Oral QAC breakfast Harvie Heck, MD   145 mcg at 02/16/21 0831  . metoCLOPramide (REGLAN) tablet 10 mg  10 mg Oral TID PRN Harvie Heck, MD   10 mg at 02/16/21 0742  .  pantoprazole (PROTONIX) EC tablet 40 mg  40 mg Oral BID AC Aslam, Loralyn Freshwater, MD   40 mg at 02/17/21 0921  . polyethylene glycol (MIRALAX / GLYCOLAX) packet 17 g  17 g Oral BID Darrick Meigs, Rylee, MD   17 g at 02/16/21 1235  . senna-docusate (Senokot-S) tablet 2 tablet  2 tablet Oral BID Mitzi Hansen, MD   2 tablet at 02/16/21 1235  . sodium chloride flush (NS) 0.9 % injection 10-40 mL  10-40 mL Intracatheter PRN Campbell Riches, MD      . sodium chloride flush (NS) 0.9  % injection 3 mL  3 mL Intravenous Q12H Aslam, Sadia, MD   3 mL at 02/17/21 0923  . zolpidem (AMBIEN) tablet 5 mg  5 mg Oral QHS PRN Harvie Heck, MD   5 mg at 02/16/21 2049      Review of Systems: 10 systems reviewed and negative except per interval history/subjective  Physical Exam: Vitals:   02/17/21 0347 02/17/21 0751  BP: (!) 151/68 (!) 143/63  Pulse: 67 63  Resp: 17 18  Temp: 98.3 F (36.8 C) 97.8 F (36.6 C)  SpO2: 99% 99%   No intake/output data recorded.  Intake/Output Summary (Last 24 hours) at 02/17/2021 0951 Last data filed at 02/16/2021 1330 Gross per 24 hour  Intake 120 ml  Output 1000 ml  Net -880 ml   Constitutional: well-appearing, no acute distress ENMT: ears and nose without scars or lesions, MMM CV: normal rate Respiratory: Bilateral chest rise, normal work of breathing Gastrointestinal: soft, non-tender, no palpable masses or hernias Skin: no visible lesions or rashes Psych: alert, judgement/insight appropriate, appropriate mood and affect   Test Results I personally reviewed new and old clinical labs and radiology tests Lab Results  Component Value Date   NA 137 02/17/2021   K 4.7 02/17/2021   CL 102 02/17/2021   CO2 29 02/17/2021   BUN 46 (H) 02/17/2021   CREATININE 3.18 (H) 02/17/2021   CALCIUM 7.9 (L) 02/17/2021   ALBUMIN 2.4 (L) 02/17/2021   PHOS 4.0 02/17/2021

## 2021-02-18 LAB — RENAL FUNCTION PANEL
Albumin: 2.8 g/dL — ABNORMAL LOW (ref 3.5–5.0)
Anion gap: 5 (ref 5–15)
BUN: 50 mg/dL — ABNORMAL HIGH (ref 6–20)
CO2: 27 mmol/L (ref 22–32)
Calcium: 8.3 mg/dL — ABNORMAL LOW (ref 8.9–10.3)
Chloride: 106 mmol/L (ref 98–111)
Creatinine, Ser: 3.32 mg/dL — ABNORMAL HIGH (ref 0.44–1.00)
GFR, Estimated: 16 mL/min — ABNORMAL LOW (ref >60)
Glucose, Bld: 112 mg/dL — ABNORMAL HIGH (ref 70–99)
Phosphorus: 4.4 mg/dL (ref 2.5–4.6)
Potassium: 4.3 mmol/L (ref 3.5–5.1)
Sodium: 138 mmol/L (ref 135–145)

## 2021-02-18 LAB — CBC
HCT: 27.2 % — ABNORMAL LOW (ref 36.0–46.0)
Hemoglobin: 8.6 g/dL — ABNORMAL LOW (ref 12.0–15.0)
MCH: 31.6 pg (ref 26.0–34.0)
MCHC: 31.6 g/dL (ref 30.0–36.0)
MCV: 100 fL (ref 80.0–100.0)
Platelets: 292 10*3/uL (ref 150–400)
RBC: 2.72 MIL/uL — ABNORMAL LOW (ref 3.87–5.11)
RDW: 13.4 % (ref 11.5–15.5)
WBC: 8.5 10*3/uL (ref 4.0–10.5)
nRBC: 0 % (ref 0.0–0.2)

## 2021-02-18 LAB — GLUCOSE, CAPILLARY
Glucose-Capillary: 89 mg/dL (ref 70–99)
Glucose-Capillary: 111 mg/dL — ABNORMAL HIGH (ref 70–99)
Glucose-Capillary: 173 mg/dL — ABNORMAL HIGH (ref 70–99)
Glucose-Capillary: 153 mg/dL — ABNORMAL HIGH (ref 70–99)

## 2021-02-18 LAB — VITAMIN B12: Vitamin B-12: 195 pg/mL (ref 180–914)

## 2021-02-18 MED ORDER — ENOXAPARIN SODIUM 40 MG/0.4ML IJ SOSY
40.0000 mg | PREFILLED_SYRINGE | Freq: Once | INTRAMUSCULAR | Status: AC
  Administered 2021-02-18: 40 mg via SUBCUTANEOUS
  Filled 2021-02-18: qty 0.4

## 2021-02-18 MED ORDER — INSULIN GLARGINE 100 UNIT/ML ~~LOC~~ SOLN
9.0000 [IU] | Freq: Every day | SUBCUTANEOUS | Status: DC
  Administered 2021-02-18 – 2021-02-20 (×3): 9 [IU] via SUBCUTANEOUS
  Filled 2021-02-18 (×3): qty 0.09

## 2021-02-18 MED ORDER — SODIUM CHLORIDE 0.9 % IV SOLN
510.0000 mg | Freq: Once | INTRAVENOUS | Status: AC
  Administered 2021-02-18: 510 mg via INTRAVENOUS
  Filled 2021-02-18: qty 17

## 2021-02-18 MED ORDER — CYANOCOBALAMIN 1000 MCG/ML IJ SOLN
1000.0000 ug | Freq: Every day | INTRAMUSCULAR | Status: DC
  Administered 2021-02-18 – 2021-02-20 (×3): 1000 ug via INTRAMUSCULAR
  Filled 2021-02-18 (×3): qty 1

## 2021-02-18 MED ORDER — SODIUM CHLORIDE 0.9 % IV SOLN: INTRAVENOUS | Status: AC

## 2021-02-18 MED ORDER — ENOXAPARIN SODIUM 80 MG/0.8ML IJ SOSY
80.0000 mg | PREFILLED_SYRINGE | INTRAMUSCULAR | Status: DC
  Administered 2021-02-19 – 2021-02-20 (×2): 80 mg via SUBCUTANEOUS
  Filled 2021-02-18 (×2): qty 0.8

## 2021-02-18 MED FILL — Insulin Regular (Human) Inj 100 Unit/ML: INTRAMUSCULAR | Qty: 0.1 | Status: AC

## 2021-02-18 NOTE — Progress Notes (Signed)
Due to her BMI of 53, ok to increase her DVT prophylaxis Lovenox to '80mg'$  SQ qday per Dr. Marva Panda. We will check a level if she is still here in a few days.   Onnie Boer, PharmD, BCIDP, AAHIVP, CPP Infectious Disease Pharmacist 02/18/2021 11:28 AM

## 2021-02-18 NOTE — Progress Notes (Signed)
Subjective:   No acute events overnight.   This morning, Emma Patton says she's doing well and feels better overall. Feels her mental status has cleared up significantly. She notes she's had some lower abdominal pain and diarrhea (4-5 loose stools daily) over the past couple of days. She has not received Linzess or Senokot-S since 5/22. She says she only gets dyspneic when moving about but otherwise is not SOB. She says she was told her HR was high / irregular last night but denied any cardiac symptoms then or now aside from chronic light-headedness.   Objective:  Vital signs in last 24 hours: Vitals:   02/18/21 0342 02/18/21 0811 02/18/21 1213 02/18/21 1629  BP: (!) 147/67 (!) 155/68 (!) 150/68 139/68  Pulse: 67 61 66 60  Resp: '18 12 13 15  '$ Temp: 98 F (36.7 C) 97.9 F (36.6 C) 97.9 F (36.6 C) 97.8 F (36.6 C)  TempSrc: Oral Oral Oral Oral  SpO2: 94% 97% 96% 97%  Weight:      Height:       CMP Latest Ref Rng & Units 02/18/2021 02/17/2021 02/16/2021  Glucose 70 - 99 mg/dL 112(H) 115(H) 109(H)  BUN 6 - 20 mg/dL 50(H) 46(H) 62(H)  Creatinine 0.44 - 1.00 mg/dL 3.32(H) 3.18(H) 4.53(H)  Sodium 135 - 145 mmol/L 138 137 136  Potassium 3.5 - 5.1 mmol/L 4.3 4.7 5.4(H)  Chloride 98 - 111 mmol/L 106 102 101  CO2 22 - 32 mmol/L '27 29 29  '$ Calcium 8.9 - 10.3 mg/dL 8.3(L) 7.9(L) 7.9(L)  Total Protein 6.5 - 8.1 g/dL - - -  Total Bilirubin 0.3 - 1.2 mg/dL - - -  Alkaline Phos 38 - 126 U/L - - -  AST 15 - 41 U/L - - -  ALT 0 - 44 U/L - - -   CBC Latest Ref Rng & Units 02/18/2021 02/16/2021 02/15/2021  WBC 4.0 - 10.5 K/uL 8.5 8.3 9.1  Hemoglobin 12.0 - 15.0 g/dL 8.6(L) 8.0(L) 8.3(L)  Hematocrit 36.0 - 46.0 % 27.2(L) 25.0(L) 27.4(L)  Platelets 150 - 400 K/uL 292 296 333   Physical Exam  Constitutional: Extremely obese. Sitting in bed comfortably, in no acute distress.  HENT: MMM. Cardiovascular: RRR. No m/r/g. There is trace edema of the RLE and 1-2+ pitting edema over the left shin.   Respiratory: Lungs are CTA, bilaterally with normal work of breathing on room air.  GI: Soft, non-distended and not tender to palpation. No guarding or rebound. Bowel sounds are intact throughout.  Neurological: Is alert and oriented x4, no apparent focal deficits or asterixis noted.  Psych: Pleasant and cooperative.   Assessment/Plan:  Active Problems:   Diabetes mellitus type 2 with complications, uncontrolled (HCC)   Acute on chronic renal failure (HCC)   Hyperkalemia   Encounter for central line placement  Emma Patton is a 50 year old female with PMHx of IBS, uncontrolled type II diabetes mellitus, chronic osteomyelitis of the spine, chronic back pain, extreme obesity, and paroxysmal atrial fibrillation admitted with acute on chronic renal failure with severe hyperkalemia, improving.    Acute on chronic renal failure Hyperkalemia, resolved Likely due to polypharmacy (bactrim, lasix, ARB) and possible hypotension. Creatinine today is just slightly up from yesterday at 3.32 with GFR 16. Potassium is normal. Her last HD session was 02/16/21. Mental status has significantly improved and she is auto-diuresing well.  - Nephrology consulted, appreciate recommendations and assistance - NS at 75/hr x 24 hours  - Continue to trend  renal function daily  - If renal function improving, can consider discontinuation of HD catheter  - Maintain foley catheter for now for strict I&O - Daily weights  - Avoid nephrotoxic agents and renally adjust medication doses   Bradycardia  Non-Sustained VT w/ PVC's Today, Emma Patton was noted to be bradycardic with frequent PVC's / short (4-beat) runs of VT overnight. Her pulse was persistently NSR otherwise in the 50's.  - No room to tolerate beta-blocker currently  - Continue to monitor  Chronic anemia with vitamin B12, folate and iron deficiencies Hb stable this morning. No signs of bleeding on exam.  - Received IV feraheme x 1 today  -  Folic acid '5mg'$  daily  - Daily vitamin B12 1045mg injection x 7 days then weekly outpatient  - CBC daily, transfuse for Hb<7  Acute Diarrhea Patient had endorsed constipation leading up to her hospital stay although has had 4-5 watery bowel movements over the past couple of days.  - Discontinued constipation medications  - Will check C. Diff labs if diarrhea continues despite this + iron   Type II DM Morning sugars have been low normal although overall remain well-controlled on SSI throughout the day.  - Decrease evening Lantus to 9U + SSI - CBG monitoring   Chronic pain syndrome Takes percocet 10-'325mg'$  q8 hours scheduled at home.  - Continue PO dilaudid '2mg'$  every 3 hours and adjust home regimen to required dilaudid levels upon discharge given recurrent admissions for renal dysfunction  - Lyrica '75mg'$  TID  Chronic bilateral lower extremity edema R foot ulceration Pitting edema and foot wound remain stable.   - Continue to monitor - Wound care of R foot ulcer per [facility] RN recommendations   Diet: Renal / CM w/ 12013mfluid restriction Fluids: NS @ 75/hr x 24 hours DVT Prophylaxis: Increased to Lovenox '80mg'$  q24 hours  Bowel regimen: Discontinued Miralax, Senokot and Linzess Code status: FULL  Prior to Admission Living Arrangement: Home  Anticipated Discharge Location: Home w/HH PT/OT (24 hour assistance) - already ordered Barriers to Discharge: Continued medical management  Dispo: Anticipated discharge in 1-2 days pending improvement in renal function.   SpJeralyn BennettMD  [facility] PGY-1 02/18/2021, 6:49 PM Pager:[phone] After 5pm on weekdays and 1pm on weekends: On Call pager [phone]

## 2021-02-18 NOTE — Progress Notes (Signed)
Nephrology Follow-Up note   Assessment/Recommendations: Emma Patton is a/an 50 y.o. female with a past medical history significant for HTN, DM2, obesity, CKD 3B, admitted for acute renal failure and hyperkalemia.     Non-Oliguric severe AKI on ckd:  # Severe AKI with creatinine up to 7.9 from baseline of 1.5-2.  Unclear what precipitated her AKI but likely combination of medications including Bactrim, Lasix, ARB along with possible hypotensive episodes.  Similar episode in the past attributed to same. Obstruction may have contributed but no obvious hydronephrosis on renal ultrasound.  Her urine is bland arguing against any glomerular disease. - No acute indication for dialysis today; cr up a bit today  - NS at 75/hr x 24 hours   - Dialysis catheter in place - continue for now and hope to remove soon - Maintain Foley catheter for now  Severe hyperkalemia: Significantly improved with medical therapy and dialysis.  Continue to monitor  Hypertension: Some low blood pressure since admission.  Amlodipine stopped.  Avoid hypotension   Anemia macrocytic - iron deficient -feraheme 510 mg IV once on 5/23.  B12 low - repletion per primary team   Uncontrolled Diabetes Mellitus Type 2 with Hyperglycemia: Management per primary team  Hyperphosphatemia: Associated with renal failure.  Resolved with HD.  Hyponatremia: Resolved with dialysis.   ________________________________________________________  Interval History/Subjective:  Had 2.9 L UOP over 5/22.  Last HD on 5/21 with no UF/kept even.   Review of systems:  Shortness of breath with exertion but not rest Denies chest pain  Denies n/v but has had diarrhea the past couple of days (began here)  Medications:  Current Facility-Administered Medications  Medication Dose Route Frequency Provider Last Rate Last Admin  . acetaminophen (TYLENOL) tablet 650 mg  650 mg Oral Q6H PRN Aslam, Loralyn Freshwater, MD       Or  . acetaminophen (TYLENOL)  suppository 650 mg  650 mg Rectal Q6H PRN Aslam, Sadia, MD      . atorvastatin (LIPITOR) tablet 10 mg  10 mg Oral Daily Aslam, Sadia, MD   10 mg at 02/18/21 0926  . Chlorhexidine Gluconate Cloth 2 % PADS 6 each  6 each Topical Daily Campbell Riches, MD   6 each at 02/18/21 [phone]  . Chlorhexidine Gluconate Cloth 2 % PADS 6 each  6 each Topical Q0600 Rosita Fire, MD   6 each at 02/18/21 0615  . cyanocobalamin ((VITAMIN B-12)) injection 1,000 mcg  1,000 mcg Intramuscular Daily Jeralyn Bennett, MD      . enoxaparin (LOVENOX) injection 40 mg  40 mg Subcutaneous Once Pham, Beaver, RPH-CPP      . [START ON 02/19/2021] enoxaparin (LOVENOX) injection 80 mg  80 mg Subcutaneous Q24H Pham, Minh Q, RPH-CPP      . folic acid (FOLVITE) tablet 5 mg  5 mg Oral Daily Jeralyn Bennett, MD   5 mg at 02/18/21 [id number]  . guaiFENesin (ROBITUSSIN) 100 MG/5ML solution 100 mg  5 mL Oral Q4H PRN Sanjuan Dame, MD      . HYDROmorphone (DILAUDID) tablet 2 mg  2 mg Oral Q3H PRN Harvie Heck, MD   2 mg at 02/18/21 1019  . insulin aspart (novoLOG) injection 0-20 Units  0-20 Units Subcutaneous TID WC Harvie Heck, MD   5 Units at 02/17/21 1843  . insulin glargine (LANTUS) injection 10 Units  10 Units Subcutaneous QHS Harvie Heck, MD   10 Units at 02/17/21 2130  . metoCLOPramide (REGLAN) tablet 10 mg  10 mg Oral  TID PRN Harvie Heck, MD   10 mg at 02/18/21 1019  . pantoprazole (PROTONIX) EC tablet 40 mg  40 mg Oral BID AC Aslam, Loralyn Freshwater, MD   40 mg at 02/18/21 0926  . polyethylene glycol (MIRALAX / GLYCOLAX) packet 17 g  17 g Oral BID Darrick Meigs, Rylee, MD   17 g at 02/16/21 1235  . pregabalin (LYRICA) capsule 75 mg  75 mg Oral TID Harvie Heck, MD   75 mg at 02/18/21 0926  . sodium chloride flush (NS) 0.9 % injection 10-40 mL  10-40 mL Intracatheter PRN Campbell Riches, MD      . sodium chloride flush (NS) 0.9 % injection 3 mL  3 mL Intravenous Q12H Harvie Heck, MD   3 mL at 02/18/21 [id number]  . zolpidem (AMBIEN) tablet  5 mg  5 mg Oral QHS PRN Harvie Heck, MD   5 mg at 02/17/21 2131      Physical Exam: Vitals:   02/18/21 0811 02/18/21 1213  BP: (!) 155/68 (!) 150/68  Pulse: 61 66  Resp: 12 13  Temp: 97.9 F (36.6 C) 97.9 F (36.6 C)  SpO2: 97% 96%   Total I/O In: 360 [P.O.:360] Out: 150 [Urine:150]  Intake/Output Summary (Last 24 hours) at 02/18/2021 1219 Last data filed at 02/18/2021 [id number] Gross per 24 hour  Intake 360 ml  Output 1600 ml  Net -1240 ml   General adult female in bed in no acute distress HEENT normocephalic atraumatic extraocular movements intact sclera anicteric Neck supple trachea midline Lungs clear to auscultation bilaterally normal work of breathing at rest  Heart S1S2 no rub Abdomen soft nontender nondistended Extremities no pitting edema  Psych normal mood and affect Neuro alert and oriented x3 provides hx and follows commands Access RIJ nontunneled catheter   Test Results I personally reviewed new and old clinical labs and radiology tests Lab Results  Component Value Date   NA 138 02/18/2021   K 4.3 02/18/2021   CL 106 02/18/2021   CO2 27 02/18/2021   BUN 50 (H) 02/18/2021   CREATININE 3.32 (H) 02/18/2021   CALCIUM 8.3 (L) 02/18/2021   ALBUMIN 2.8 (L) 02/18/2021   PHOS 4.4 02/18/2021     Claudia Desanctis, MD 02/18/2021 12:39 PM

## 2021-02-18 NOTE — Progress Notes (Signed)
Physical Therapy Treatment Patient Details Name: Nyleah Mcginnis MRN: [id number] DOB: 01-Jun-1971 Today's Date: 02/18/2021    History of Present Illness 50 y.o. female  transfer from [place] with ARF after 1 week of variable Glc on her monitor and 3 days of decreased po. Admitted to [facility] 02/14/21 for emergent HD for treatment of acute renal failure and hyperkalemia.with a past medical history significant for HTN, DM2, obesity, CKD 3B,    PT Comments    Patient received in bed, very pleasant and cooperative, motivated to mobilize. Able to get to EOB and pivot into recliner at [place] guard-Min assist level with RW, however very fatigued afterwards. Did tolerate heel raises and standing forward toe taps with RW/min guard today as pre-gait based exercises. VSS on 3LPM O2. Left up in recliner with all needs met, chair alarm active. Will continue to follow.    Follow Up Recommendations  Home health PT;Supervision for mobility/OOB     Equipment Recommendations  Other (comment)    Recommendations for Other Services       Precautions / Restrictions Precautions Precautions: Fall Restrictions Weight Bearing Restrictions: No    Mobility  Bed Mobility Overal bed mobility: Needs Assistance Bed Mobility: Supine to Sit     Supine to sit: Min assist     General bed mobility comments: MinA to get hips forward and advance trunk mostly due to having trouble gaining purchase on air mattress    Transfers Overall transfer level: Needs assistance Equipment used: Rolling walker (2 wheeled) Transfers: Sit to/from [facility] Sit to Stand: Min guard;From elevated surface Stand pivot transfers: Min guard       General transfer comment: Min guard for safety and line management.  Ambulation/Gait             General Gait Details: declined- fatigue   Stairs             Wheelchair Mobility    Modified Rankin (Stroke Patients Only)       Balance Overall  balance assessment: Needs assistance Sitting-balance support: Feet supported;No upper extremity supported Sitting balance-Leahy Scale: Fair     Standing balance support: Bilateral upper extremity supported Standing balance-Leahy Scale: Poor Standing balance comment: Reliant on BUE on RW.                            Cognition Arousal/Alertness: Awake/alert Behavior During Therapy: WFL for tasks assessed/performed Overall Cognitive Status: Within Functional Limits for tasks assessed                                 General Comments: pleasant and cooperative, a bit anxious with mobility      Exercises      General Comments General comments (skin integrity, edema, etc.): VSS on 3LPM O2      Pertinent Vitals/Pain Pain Assessment: 0-10 Pain Score: 2  Pain Location: LLE at rest worsening with movement (nerve related) Pain Descriptors / Indicators: Pins and needles;Guarding Pain Intervention(s): Limited activity within patient's tolerance;Monitored during session;Repositioned    Home Living                      Prior Function            PT Goals (current goals can now be found in the care plan section) Acute Rehab PT Goals Patient Stated Goal: be more independent PT Goal Formulation:  With patient Time For Goal Achievement: 03/02/21 Potential to Achieve Goals: Fair Progress towards PT goals: Progressing toward goals    Frequency    Min 3X/week      PT Plan Current plan remains appropriate    Co-evaluation              AM-PAC PT "6 Clicks" Mobility   Outcome Measure  Help needed turning from your back to your side while in a flat bed without using bedrails?: A Little Help needed moving from lying on your back to sitting on the side of a flat bed without using bedrails?: A Little Help needed moving to and from a bed to a chair (including a wheelchair)?: A Little Help needed standing up from a chair using your arms (e.g.,  wheelchair or bedside chair)?: A Little Help needed to walk in hospital room?: A Little Help needed climbing 3-5 steps with a railing? : A Lot 6 Click Score: 17    End of Session Equipment Utilized During Treatment: Oxygen Activity Tolerance: Patient tolerated treatment well Patient left: in chair;with call bell/phone within reach;with chair alarm set Nurse Communication: Mobility status PT Visit Diagnosis: Unsteadiness on feet (R26.81);Other abnormalities of gait and mobility (R26.89);Muscle weakness (generalized) (M62.81);Difficulty in walking, not elsewhere classified (R26.2);Pain Pain - Right/Left: Left Pain - part of body: Leg     Time: 8301-5996 PT Time Calculation (min) (ACUTE ONLY): 25 min  Charges:  $Therapeutic Activity: 23-37 mins                     Windell Norfolk, DPT, PN1   Supplemental Physical Therapist [place]    Pager [phone] Acute Rehab Office [phone]

## 2021-02-18 NOTE — Progress Notes (Signed)
Occupational Therapy Treatment Patient Details Name: Emma Patton MRN: [id number] DOB: 01-26-1971 Today's Date: 02/18/2021    History of present illness 50 y.o. female  transfer from [place] with ARF after 1 week of variable Glc on her monitor and 3 days of decreased po. Admitted to [facility] 02/14/21 for emergent HD for treatment of acute renal failure and hyperkalemia.with a past medical history significant for HTN, DM2, obesity, CKD 3B,   OT comments  Pt educated in use of AE for LB ADL, she prefers to rely on her husband's assistance. Performing seated grooming with set up, unable to tolerate standing at sink due to h/o back pain. Pt transferring to [facility] with min guard assist and RW. Placed pillows under buttocks for comfort in chair.   Follow Up Recommendations  Home health OT;Supervision/Assistance - 24 hour    Equipment Recommendations  None recommended by OT    Recommendations for Other Services      Precautions / Restrictions Precautions Precautions: Fall Restrictions Weight Bearing Restrictions: No       Mobility Bed Mobility      General bed mobility comments: pt received in chair    Transfers Overall transfer level: Needs assistance Equipment used: Rolling walker (2 wheeled) Transfers: Sit to/from [facility] Sit to Stand: Min guard Stand pivot transfers: Min guard       General transfer comment: Min guard for safety and line management.    Balance Overall balance assessment: Needs assistance Sitting-balance support: Feet supported;No upper extremity supported Sitting balance-Leahy Scale: Fair     Standing balance support: Bilateral upper extremity supported Standing balance-Leahy Scale: Poor Standing balance comment: Reliant on BUE on RW.                           ADL either performed or assessed with clinical judgement   ADL Overall ADL's : Needs assistance/impaired     Grooming: Set up;Sitting;Wash/dry  hands;Wash/dry face;Oral care;Brushing hair         Lower Body Bathing Details (indicate cue type and reason): educated in use of long handled bath sponge and reacher Upper Body Dressing : Set up;Sitting     Lower Body Dressing Details (indicate cue type and reason): educated in use of reacher and sock aid Toilet Transfer: Min guard;Stand-pivot;RW;BSC   Toileting- Clothing Manipulation and Hygiene: Minimal assistance;Sit to/from stand               Vision       Perception     Praxis      Cognition Arousal/Alertness: Awake/alert Behavior During Therapy: WFL for tasks assessed/performed Overall Cognitive Status: Within Functional Limits for tasks assessed                                 General Comments: pt reports she is not at her baseline cognitively        Exercises     Shoulder Instructions       General Comments VSS on 3LPM O2    Pertinent Vitals/ Pain       Pain Assessment: Faces Pain Score: 2  Faces Pain Scale: Hurts little more Pain Location: buttocks Pain Descriptors / Indicators: Sore Pain Intervention(s): Repositioned (placed pillows under buttocks)  Home Living  Prior Functioning/Environment              Frequency  Min 2X/week        Progress Toward Goals  OT Goals(current goals can now be found in the care plan section)  Progress towards OT goals: Progressing toward goals  Acute Rehab OT Goals Patient Stated Goal: be more independent OT Goal Formulation: With patient Time For Goal Achievement: 03/03/21 Potential to Achieve Goals: Good  Plan Discharge plan remains appropriate    Co-evaluation                 AM-PAC OT "6 Clicks" Daily Activity     Outcome Measure   Help from another person eating meals?: None Help from another person taking care of personal grooming?: A Little Help from another person toileting, which includes using  toliet, bedpan, or urinal?: A Little Help from another person bathing (including washing, rinsing, drying)?: A Lot Help from another person to put on and taking off regular upper body clothing?: A Little Help from another person to put on and taking off regular lower body clothing?: A Lot 6 Click Score: 17    End of Session Equipment Utilized During Treatment: Gait belt;Rolling walker;Oxygen (2L)  OT Visit Diagnosis: Unsteadiness on feet (R26.81);Other abnormalities of gait and mobility (R26.89);Muscle weakness (generalized) (M62.81)   Activity Tolerance Patient tolerated treatment well   Patient Left in bed;with call bell/phone within reach   Nurse Communication          Time: [id number] OT Time Calculation (min): 29 min  Charges: OT General Charges $OT Visit: 1 Visit OT Treatments $Self Care/Home Management : 23-37 mins  Nestor Lewandowsky, OTR/L Acute Rehabilitation Services Pager: [phone] Office: [phone]   Emma Patton 02/18/2021, 2:47 PM

## 2021-02-19 LAB — RENAL FUNCTION PANEL
Albumin: 2.8 g/dL — ABNORMAL LOW (ref 3.5–5.0)
Anion gap: 6 (ref 5–15)
BUN: 51 mg/dL — ABNORMAL HIGH (ref 6–20)
CO2: 24 mmol/L (ref 22–32)
Calcium: 8.3 mg/dL — ABNORMAL LOW (ref 8.9–10.3)
Chloride: 110 mmol/L (ref 98–111)
Creatinine, Ser: 2.97 mg/dL — ABNORMAL HIGH (ref 0.44–1.00)
GFR, Estimated: 18 mL/min — ABNORMAL LOW (ref >60)
Glucose, Bld: 129 mg/dL — ABNORMAL HIGH (ref 70–99)
Phosphorus: 4.3 mg/dL (ref 2.5–4.6)
Potassium: 4.1 mmol/L (ref 3.5–5.1)
Sodium: 140 mmol/L (ref 135–145)

## 2021-02-19 LAB — C DIFFICILE (CDIFF) QUICK SCRN (NO PCR REFLEX)
C Diff antigen: NEGATIVE
C Diff interpretation: NOT DETECTED
C Diff toxin: NEGATIVE

## 2021-02-19 LAB — GLUCOSE, CAPILLARY
Glucose-Capillary: 99 mg/dL (ref 70–99)
Glucose-Capillary: 110 mg/dL — ABNORMAL HIGH (ref 70–99)
Glucose-Capillary: 169 mg/dL — ABNORMAL HIGH (ref 70–99)
Glucose-Capillary: 142 mg/dL — ABNORMAL HIGH (ref 70–99)

## 2021-02-19 MED ORDER — SODIUM CHLORIDE 0.9 % IV SOLN: INTRAVENOUS | Status: DC

## 2021-02-19 MED ORDER — PREGABALIN 75 MG PO CAPS
75.0000 mg | ORAL_CAPSULE | Freq: Every day | ORAL | Status: DC
  Administered 2021-02-20: 75 mg via ORAL
  Filled 2021-02-19: qty 1

## 2021-02-19 MED ORDER — METOPROLOL TARTRATE 12.5 MG HALF TABLET
12.5000 mg | ORAL_TABLET | Freq: Two times a day (BID) | ORAL | Status: DC
  Filled 2021-02-19 (×2): qty 1

## 2021-02-19 MED ORDER — LOPERAMIDE HCL 2 MG PO CAPS
2.0000 mg | ORAL_CAPSULE | ORAL | Status: DC | PRN
  Administered 2021-02-19 – 2021-02-20 (×5): 2 mg via ORAL
  Filled 2021-02-19 (×5): qty 1

## 2021-02-19 NOTE — Progress Notes (Signed)
Physical Therapy Treatment Patient Details Name: Emma Patton MRN: [id number] DOB: 1971/03/12 Today's Date: 02/19/2021    History of Present Illness 50 y.o. female  transfer from [place] with ARF after 1 week of variable Glc on her monitor and 3 days of decreased po. Admitted to [facility] 02/14/21 for emergent HD for treatment of acute renal failure and hyperkalemia.with a past medical history significant for HTN, DM2, obesity, CKD 3B,    PT Comments    Patient received in recliner very pleasant and cooperative. Discussed her plans to return home and problem solved home entry and in home mobility, including having clear pathways in the home, potential hospital bed, wheelchair, 2 wheeled RW instead of rollator, and technique to use shower chair to navigate steps in/out of her home. Otherwise attempted to progress session, but we were limited due to onset of urgent diarrhea. Left on [address] [facility] with all needs met and call bell in reach, nursing staff aware of patient status.     Follow Up Recommendations  Outpatient PT;Supervision for mobility/OOB     Equipment Recommendations  Rolling walker with 5" wheels;Other (comment);Wheelchair cushion (measurements PT);Wheelchair (measurements PT) (shower bench; all bariatric equipment)    Recommendations for Other Services       Precautions / Restrictions Precautions Precautions: Fall Restrictions Weight Bearing Restrictions: No    Mobility  Bed Mobility               General bed mobility comments: pt received in chair    Transfers Overall transfer level: Needs assistance Equipment used: Rolling walker (2 wheeled) Transfers: Sit to/from Stand Sit to Stand: Min guard         General transfer comment: Min guard for safety and line management, cues for hand placement  Ambulation/Gait Ambulation/Gait assistance: Min guard Gait Distance (Feet): 4 Feet Assistive device: Rolling walker (2 wheeled) Gait Pattern/deviations: Step-to  pattern;Decreased step length - right;Decreased step length - left;Trunk flexed;Wide base of support Gait velocity: slowed   General Gait Details: slow but steady with RW, gait distance limited due to urgently needing toilet due to diarrhea   Stairs             Wheelchair Mobility    Modified Rankin (Stroke Patients Only)       Balance Overall balance assessment: Needs assistance Sitting-balance support: Feet supported;No upper extremity supported Sitting balance-Leahy Scale: Good     Standing balance support: Bilateral upper extremity supported Standing balance-Leahy Scale: Poor Standing balance comment: Reliant on BUE on RW.                            Cognition Arousal/Alertness: Awake/alert Behavior During Therapy: WFL for tasks assessed/performed Overall Cognitive Status: Within Functional Limits for tasks assessed                                 General Comments: pt reports she is not at her baseline cognitively- but responds and follows cues appropriately      Exercises      General Comments General comments (skin integrity, edema, etc.): VSS on RA      Pertinent Vitals/Pain Pain Assessment: No/denies pain Faces Pain Scale: No hurt Pain Intervention(s): Limited activity within patient's tolerance;Monitored during session    Home Living                      Prior Function  PT Goals (current goals can now be found in the care plan section) Acute Rehab PT Goals Patient Stated Goal: be more independent PT Goal Formulation: With patient Time For Goal Achievement: 03/02/21 Potential to Achieve Goals: Fair Progress towards PT goals: Progressing toward goals    Frequency    Min 4X/week      PT Plan Discharge plan needs to be updated;Frequency needs to be updated    Co-evaluation              AM-PAC PT "6 Clicks" Mobility   Outcome Measure  Help needed turning from your back to your side  while in a flat bed without using bedrails?: A Little Help needed moving from lying on your back to sitting on the side of a flat bed without using bedrails?: A Little Help needed moving to and from a bed to a chair (including a wheelchair)?: A Little Help needed standing up from a chair using your arms (e.g., wheelchair or bedside chair)?: A Little Help needed to walk in hospital room?: A Little Help needed climbing 3-5 steps with a railing? : A Little 6 Click Score: 18    End of Session Equipment Utilized During Treatment: Gait belt Activity Tolerance: Patient tolerated treatment well Patient left: Other (comment);with call bell/phone within reach (on [facility] with callbell in reach) Nurse Communication: Mobility status;Other (comment) (pt on BSC) PT Visit Diagnosis: Unsteadiness on feet (R26.81);Other abnormalities of gait and mobility (R26.89);Muscle weakness (generalized) (M62.81);Difficulty in walking, not elsewhere classified (R26.2);Pain Pain - Right/Left: Left Pain - part of body: Leg     Time: 1354-1415 PT Time Calculation (min) (ACUTE ONLY): 21 min  Charges:  $Therapeutic Activity: 8-22 mins                     Windell Norfolk, DPT, PN1   Supplemental Physical Therapist [place]    Pager [phone] Acute Rehab Office [phone]

## 2021-02-19 NOTE — Progress Notes (Signed)
Nephrology Follow-Up note   Assessment/Recommendations: Emma Patton is a/an 50 y.o. female with a past medical history significant for HTN, DM2, obesity, CKD 3B, admitted for acute renal failure and hyperkalemia.     Non-Oliguric severe AKI on ckd:  # Severe AKI with creatinine up to 7.9 from baseline of 1.5-2.  Unclear what precipitated her AKI but likely combination of medications including Bactrim, Lasix, ARB along with possible hypotensive episodes.  Similar episode in the past attributed to same. Obstruction may have contributed but no obvious hydronephrosis on renal ultrasound.  Her urine is bland arguing against any glomerular disease. - No acute indication for dialysis today - NS at 75/hr x 24 hours - Wrote order to remove nontunneled dialysis catheter  - remove foley catheter.  Monitor for retention per nursing protocol - would reduce lyrica to 75 mg daily given her renal failure  Severe hyperkalemia: Significantly improved with medical therapy and dialysis.  Continue to monitor off of HD  Hypertension: Some hypotension noted and per charting amlodipine stopped.  Normotensive now.  Avoid hypotension.   Anemia macrocytic - iron deficient - s/p feraheme 510 mg IV once on 5/23.  B12 low - repletion per primary team   Uncontrolled Diabetes Mellitus Type 2 with Hyperglycemia: Management per primary team  Hyperphosphatemia: Associated with renal failure.  Resolved with HD.  Hyponatremia: Resolved with dialysis.   ________________________________________________________  Interval History/Subjective:  Had 1.1 L UOP over 5/23.  Last HD on 5/21 with no UF/kept even.  Feels ok today.  She has been on NS at 75 ml/hr.  This is actually running in through dialysis catheter on my exam.  She would really like to get the dialysis catheter and the foley out.   Review of systems:    Denies shortness of breath  Denies chest pain  Some nausea no vomiting; has continued with diarrhea    Medications:  Current Facility-Administered Medications  Medication Dose Route Frequency Provider Last Rate Last Admin  . 0.9 %  sodium chloride infusion   Intravenous Continuous Claudia Desanctis, MD 75 mL/hr at 02/19/21 0406 New Bag at 02/19/21 0406  . acetaminophen (TYLENOL) tablet 650 mg  650 mg Oral Q6H PRN Aslam, Loralyn Freshwater, MD       Or  . acetaminophen (TYLENOL) suppository 650 mg  650 mg Rectal Q6H PRN Aslam, Sadia, MD      . atorvastatin (LIPITOR) tablet 10 mg  10 mg Oral Daily Aslam, Sadia, MD   10 mg at 02/19/21 0852  . Chlorhexidine Gluconate Cloth 2 % PADS 6 each  6 each Topical Daily Campbell Riches, MD   6 each at 02/19/21 [phone]  . Chlorhexidine Gluconate Cloth 2 % PADS 6 each  6 each Topical Q0600 Rosita Fire, MD   6 each at 02/19/21 [phone]  . cyanocobalamin ((VITAMIN B-12)) injection 1,000 mcg  1,000 mcg Intramuscular Daily Jeralyn Bennett, MD   1,000 mcg at 02/19/21 0853  . enoxaparin (LOVENOX) injection 80 mg  80 mg Subcutaneous Q24H Pham, Minh Q, RPH-CPP   80 mg at 02/19/21 0852  . folic acid (FOLVITE) tablet 5 mg  5 mg Oral Daily Jeralyn Bennett, MD   5 mg at 02/19/21 [id number]  . guaiFENesin (ROBITUSSIN) 100 MG/5ML solution 100 mg  5 mL Oral Q4H PRN Sanjuan Dame, MD      . HYDROmorphone (DILAUDID) tablet 2 mg  2 mg Oral Q3H PRN Harvie Heck, MD   2 mg at 02/18/21 2046  . insulin aspart (  novoLOG) injection 0-20 Units  0-20 Units Subcutaneous TID WC Harvie Heck, MD   4 Units at 02/18/21 1751  . insulin glargine (LANTUS) injection 9 Units  9 Units Subcutaneous QHS Jeralyn Bennett, MD   9 Units at 02/18/21 2128  . metoCLOPramide (REGLAN) tablet 10 mg  10 mg Oral TID PRN Harvie Heck, MD   10 mg at 02/18/21 1019  . pantoprazole (PROTONIX) EC tablet 40 mg  40 mg Oral BID AC Aslam, Loralyn Freshwater, MD   40 mg at 02/19/21 0852  . pregabalin (LYRICA) capsule 75 mg  75 mg Oral TID Harvie Heck, MD   75 mg at 02/19/21 0852  . sodium chloride flush (NS) 0.9 % injection 10-40 mL  10-40  mL Intracatheter PRN Campbell Riches, MD   10 mL at 02/19/21 0403  . sodium chloride flush (NS) 0.9 % injection 3 mL  3 mL Intravenous Q12H Aslam, Loralyn Freshwater, MD   3 mL at 02/19/21 0853  . zolpidem (AMBIEN) tablet 5 mg  5 mg Oral QHS PRN Harvie Heck, MD   5 mg at 02/18/21 2128      Physical Exam: Vitals:   02/19/21 0521 02/19/21 1115  BP: 130/62 134/75  Pulse: (!) 56 61  Resp: 19 18  Temp: 97.9 F (36.6 C) 98 F (36.7 C)  SpO2: 97% 96%   Total I/O In: 600 [P.O.:600] Out: 600 [Urine:600]  Intake/Output Summary (Last 24 hours) at 02/19/2021 1327 Last data filed at 02/19/2021 1041 Gross per 24 hour  Intake 780 ml  Output 1051 ml  Net -271 ml   General adult female in bed in no acute distress  HEENT normocephalic atraumatic extraocular movements intact sclera anicteric Neck supple trachea midline Lungs clear to auscultation bilaterally normal work of breathing at rest  Heart S1S2 no rub Abdomen soft nontender nondistended Extremities left leg 1+ edema; no right leg edema  Psych normal mood and affect Neuro alert and oriented x3 provides hx and follows commands Access RIJ nontunneled catheter GU foley in place   Test Results I personally reviewed new and old clinical labs and radiology tests Lab Results  Component Value Date   NA 140 02/19/2021   K 4.1 02/19/2021   CL 110 02/19/2021   CO2 24 02/19/2021   BUN 51 (H) 02/19/2021   CREATININE 2.97 (H) 02/19/2021   CALCIUM 8.3 (L) 02/19/2021   ALBUMIN 2.8 (L) 02/19/2021   PHOS 4.3 02/19/2021     Claudia Desanctis, MD 02/19/2021 1:49 PM

## 2021-02-19 NOTE — Care Management Important Message (Signed)
Important Message  Patient Details  Name: Emma Patton MRN: [id number] Date of Birth: May 11, 1971   Medicare Important Message Given:  Yes - Important Message mailed due to current National Emergency  Verbal consent obtained due to current National Emergency  Relationship to patient: Self Contact Name: Naleah Call Date: 02/19/21  Time: 1349 Phone: [phone] Outcome: No Answer/Busy Important Message mailed to: Patient address on file    Delorse Lek 02/19/2021, 1:49 PM

## 2021-02-19 NOTE — TOC Initial Note (Signed)
Transition of Care [facility]) - Initial/Assessment Note    Patient Details  Name: Emma Patton MRN: [id number] Date of Birth: 1971-08-06  [facility]) CM/SW Contact:    Benard Halsted, LCSW Phone Number: 02/19/2021, 9:22 AM  Clinical Narrative:                 CSW received consult for possible home health services at time of discharge. CSW spoke with patient. Patient reported that she does not want home health services due to her and her husband moving in to a mobile home that needs "a lot of work". CSW asked patient if she felt strong and safe enough to return home rather than SNF. She stated adamantly that she wanted to return home with her husband today if possible as it is their wedding anniversary. CSW asked if she would be interested in outpatient therapy and she said she would consider that. CSW will place info on AVS. CSW discussed equipment needs with patient and she has a rolling walker at home with no other DME needs but CSW will check on need for oxygen at discharge. CSW confirmed PCP (Dr. Jannette Fogo with [facility]) and address with patient. Patient requests PTAR transport home. Either she or her husband drives her to dialysis normally but she is open to contacting [facility] to see if they would be able to take her to dialysis; CSW will place info on AVS.    Expected Discharge Plan: OP Rehab Barriers to Discharge: Continued Medical Work up   Patient Goals and CMS Choice Patient states their goals for this hospitalization and ongoing recovery are:: Return home CMS Medicare.gov Compare Post Acute Care list provided to:: Patient Choice offered to / list presented to : Patient  Expected Discharge Plan and Services Expected Discharge Plan: OP Rehab In-house Referral: Clinical Social Work Discharge Planning Services: CM Consult Post Acute Care Choice: Dialysis (OP therapy) Living arrangements for the past 2 months: Mobile Home                  DME Arranged: N/A         HH Arranged: Refused HH,Refused SNF          Prior Living Arrangements/Services Living arrangements for the past 2 months: Mobile Home Lives with:: Spouse Patient language and need for interpreter reviewed:: Yes Do you feel safe going back to the place where you live?: Yes      Need for Family Participation in Patient Care: No (Comment) Care giver support system in place?: Yes (comment) Current home services: DME (walker) Criminal Activity/Legal Involvement Pertinent to Current Situation/Hospitalization: No - Comment as needed  Activities of Daily Living Home Assistive Devices/Equipment: Cane (specify quad or straight),Walker (specify type) ADL Screening (condition at time of admission) Patient's cognitive ability adequate to safely complete daily activities?: Yes Is the patient deaf or have difficulty hearing?: No Does the patient have difficulty seeing, even when wearing glasses/contacts?: No Does the patient have difficulty concentrating, remembering, or making decisions?: No Patient able to express need for assistance with ADLs?: Yes Does the patient have difficulty dressing or bathing?: Yes Independently performs ADLs?: Yes (appropriate for developmental age) Does the patient have difficulty walking or climbing stairs?: Yes Weakness of Legs: Both Weakness of Arms/Hands: None  Permission Sought/Granted Permission sought to share information with : Facility Art therapist granted to share information with : Yes, Verbal Permission Granted     Permission granted to share info w AGENCY:  OP rehab        Emotional Assessment Appearance:: Appears stated age Attitude/Demeanor/Rapport: Engaged,Gracious,Charismatic Affect (typically observed): Accepting,Appropriate Orientation: : Oriented to Self,Oriented to Place,Oriented to  Time,Oriented to Situation Alcohol / Substance Use: Not Applicable Psych Involvement: No  (comment)  Admission diagnosis:  Hyperkalemia [E87.5] Acute on chronic renal failure (HCC) [N17.9, N18.9] AKI (acute kidney injury) ([place]) [N17.9] Patient Active Problem List   Diagnosis Date Noted  . Hyperkalemia 02/15/2021  . Encounter for central line placement   . Acute on chronic renal failure ([place]) 02/14/2021  . Toxic encephalopathy 12/26/2019  . Diabetic foot infection ([place]) 12/26/2019  . Acute hypoxemic respiratory failure ([place]) 12/20/2019  . Claustrophobia 09/04/2016  . Esophageal reflux 09/04/2016  . Leukocytosis 09/04/2016  . Osteomyelitis of toe of left foot ([place]) 09/04/2016  . Acute renal failure (ARF) ([place]) 03/21/2016  . Anemia 03/21/2016  . Gastroesophageal reflux disease 03/21/2016  . HTN (hypertension), benign 03/21/2016  . Chronic bilateral low back pain with right-sided sciatica 02/18/2016  . Discitis of lumbar region 02/18/2016  . Osteomyelitis of lumbar spine ([place]) 02/18/2016  . Osteomyelitis of spine ([place]) 02/18/2016  . Spinal stenosis 02/18/2016  . Lyme disease 12/20/2015  . Polypharmacy 06/24/2015  . PAF (paroxysmal atrial fibrillation) ([place]) 06/24/2015  . Baker's cyst of knee 06/12/2015  . Bilateral leg edema 06/12/2015  . Edema of lower extremity 06/12/2015  . Hypoalbuminemia 06/12/2015  . Diabetes mellitus ([place]) 06/04/2015  . Diabetes mellitus type 2 with complications, uncontrolled ([place]) 06/04/2015  . Elevated alkaline phosphatase level 06/04/2015  . 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

## 2021-02-19 NOTE — Progress Notes (Signed)
Subjective:   No acute events overnight.   This morning, patient states she's doing much better. She wonders when she'll go home and have her dialysis catheter removed. She continues to feel short of breath although denies any change to her chronic leg swelling or cough. She has ongoing watery diarrhea although denies nausea or abdominal pain. Does note this diarrhea is different from her typical IBS symptoms.   Objective:  Vital signs in last 24 hours: Vitals:   02/18/21 1629 02/18/21 2008 02/19/21 0521 02/19/21 1115  BP: 139/68 133/60 130/62 134/75  Pulse: 60 (!) 56 (!) 56 61  Resp: '15 18 19 18  '$ Temp: 97.8 F (36.6 C) 98 F (36.7 C) 97.9 F (36.6 C) 98 F (36.7 C)  TempSrc: Oral Oral Oral Oral  SpO2: 97% 98% 97% 96%  Weight:      Height:       CMP Latest Ref Rng & Units 02/19/2021 02/18/2021 02/17/2021  Glucose 70 - 99 mg/dL 129(H) 112(H) 115(H)  BUN 6 - 20 mg/dL 51(H) 50(H) 46(H)  Creatinine 0.44 - 1.00 mg/dL 2.97(H) 3.32(H) 3.18(H)  Sodium 135 - 145 mmol/L 140 138 137  Potassium 3.5 - 5.1 mmol/L 4.1 4.3 4.7  Chloride 98 - 111 mmol/L 110 106 102  CO2 22 - 32 mmol/L '24 27 29  '$ Calcium 8.9 - 10.3 mg/dL 8.3(L) 8.3(L) 7.9(L)  Total Protein 6.5 - 8.1 g/dL - - -  Total Bilirubin 0.3 - 1.2 mg/dL - - -  Alkaline Phos 38 - 126 U/L - - -  AST 15 - 41 U/L - - -  ALT 0 - 44 U/L - - -   CBC Latest Ref Rng & Units 02/18/2021 02/16/2021 02/15/2021  WBC 4.0 - 10.5 K/uL 8.5 8.3 9.1  Hemoglobin 12.0 - 15.0 g/dL 8.6(L) 8.0(L) 8.3(L)  Hematocrit 36.0 - 46.0 % 27.2(L) 25.0(L) 27.4(L)  Platelets 150 - 400 K/uL 292 296 333   Physical Exam  Constitutional: Extremely obese. Sitting in bed comfortably, in no acute distress.  HENT: MMM. Cardiovascular: RRR. No m/r/g. There is trace edema of the RLE and 2+ pitting edema over the left lower leg.  Respiratory: Lungs are CTA bilaterally. She has mild tachypnea although is saturating in the mid-90's on room air at rest.  GI: Soft, non-distended and  not tender to palpation. No guarding or rebound. Bowel sounds are hyperactive. Neurological: Is alert and oriented x4, no apparent focal deficits or asterixis noted.  Psych: Pleasant and cooperative.   Assessment/Plan:  Active Problems:   Diabetes mellitus type 2 with complications, uncontrolled (HCC)   Acute on chronic renal failure (HCC)   Hyperkalemia   Encounter for central line placement  Emma Patton is a 50 year old female with PMHx of IBS, uncontrolled type II diabetes mellitus, chronic osteomyelitis of the spine, chronic back pain, extreme obesity, and paroxysmal atrial fibrillation admitted with acute on chronic renal failure with severe hyperkalemia, improving.    Acute on chronic renal failure Hyperkalemia, resolved Likely due to polypharmacy (bactrim, lasix, ARB) and possible hypotension. Creatinine has improved significantly since yesterday. Potassium is normal. Her last HD session was 02/16/21 and her dialysis catheter was removed today. Mental status has significantly improved and she is auto-diuresing well. Spoke with nephrology who recommend holding home Lasix (allowing them to manage) and holding off on discharge today.  - Appreciate nephrology's assistance  - Voiding trial s/p foley catheter removal  - NS at 75/hr x 24 hours  - Continue to  trend renal function daily  - Strict I&O and daily weights  - Decreased frequency of Lyrica to '75mg'$  once daily   Bradycardia, Intermittent  Non-Sustained VT w/ PVC's Intermittent bradycardia with PVC's / short (4-beat) runs of VT during admission. Her pulse was persistently NSR otherwise. - Added Lopressor 12.'5mg'$  BID  - Continue to monitor  Chronic anemia with vitamin B12, folate and iron deficiencies Hb stable this morning. No signs of bleeding on exam.  - Received IV feraheme x 1 - Folic acid '5mg'$  daily  - Daily vitamin B12 1070mg injection x 7 days then weekly outpatient  - CBC daily, transfuse for Hb<7  Acute  Diarrhea Patient had endorsed constipation leading up to her hospital stay although has had 4-5 watery bowel movements over the past couple of days. C. Diff testing was negative. - Discontinued constipation medications - Started Imodium '2mg'$  PRN    Type II DM Sugars remain well controlled. - Continue Lantus 9U nightly + SSI - CBG monitoring   Chronic pain syndrome Takes percocet 10-'325mg'$  q8 hours scheduled at home.  - Continue PO dilaudid '2mg'$  every 3 hours and adjust home regimen on discharge  Chronic bilateral lower extremity edema R foot ulceration Pitting edema and foot wound remain stable.   - Continue to monitor - Wound care of R foot ulcer per WOC RN recommendations   Diet: Renal / CM w/ 12021mfluid restriction Fluids: NS @ 75/hr x 24 hours DVT Prophylaxis: Lovenox  Code status: FULL  Prior to Admission Living Arrangement: Home  Anticipated Discharge Location: Home w/ Outpatient PT/OT (refused HH) Barriers to Discharge: Continued medical management  Dispo: Anticipated discharge in 1-2 days pending improvement in renal function.   SpJeralyn BennettMD  IMTS PGY-1 02/19/2021, 6:16 PM Pager:[phone] After 5pm on weekdays and 1pm on weekends: On Call pager [phone]

## 2021-02-20 LAB — GLUCOSE, CAPILLARY
Glucose-Capillary: 105 mg/dL — ABNORMAL HIGH (ref 70–99)
Glucose-Capillary: 135 mg/dL — ABNORMAL HIGH (ref 70–99)
Glucose-Capillary: 140 mg/dL — ABNORMAL HIGH (ref 70–99)
Glucose-Capillary: 205 mg/dL — ABNORMAL HIGH (ref 70–99)

## 2021-02-20 LAB — RENAL FUNCTION PANEL
Albumin: 2.6 g/dL — ABNORMAL LOW (ref 3.5–5.0)
Anion gap: 3 — ABNORMAL LOW (ref 5–15)
BUN: 50 mg/dL — ABNORMAL HIGH (ref 6–20)
CO2: 22 mmol/L (ref 22–32)
Calcium: 8.2 mg/dL — ABNORMAL LOW (ref 8.9–10.3)
Chloride: 114 mmol/L — ABNORMAL HIGH (ref 98–111)
Creatinine, Ser: 2.82 mg/dL — ABNORMAL HIGH (ref 0.44–1.00)
GFR, Estimated: 20 mL/min — ABNORMAL LOW (ref >60)
Glucose, Bld: 153 mg/dL — ABNORMAL HIGH (ref 70–99)
Phosphorus: 3.6 mg/dL (ref 2.5–4.6)
Potassium: 3.9 mmol/L (ref 3.5–5.1)
Sodium: 139 mmol/L (ref 135–145)

## 2021-02-20 MED ORDER — PREGABALIN 75 MG PO CAPS
75.0000 mg | ORAL_CAPSULE | Freq: Two times a day (BID) | ORAL | Status: DC
Start: 2021-02-20 — End: 2021-08-06

## 2021-02-20 MED ORDER — FUROSEMIDE 40 MG PO TABS: 40.0000 mg | ORAL_TABLET | Freq: Every day | ORAL | 0 refills | Status: DC

## 2021-02-20 MED ORDER — OMEPRAZOLE 20 MG PO CPDR: 40.0000 mg | DELAYED_RELEASE_CAPSULE | Freq: Two times a day (BID) | ORAL | Status: AC

## 2021-02-20 MED ORDER — FOLIC ACID 1 MG PO TABS: 2.0000 mg | ORAL_TABLET | Freq: Every day | ORAL | 0 refills | Status: AC

## 2021-02-20 MED ORDER — HYDROMORPHONE HCL 2 MG PO TABS: 2.0000 mg | ORAL_TABLET | Freq: Three times a day (TID) | ORAL | 0 refills | Status: DC | PRN

## 2021-02-20 MED ORDER — SODIUM CHLORIDE 0.9% FLUSH
10.0000 mL | Freq: Two times a day (BID) | INTRAVENOUS | Status: DC
  Administered 2021-02-20: 10 mL

## 2021-02-20 MED ORDER — PREGABALIN 75 MG PO CAPS
75.0000 mg | ORAL_CAPSULE | Freq: Two times a day (BID) | ORAL | Status: DC
  Administered 2021-02-20: 75 mg via ORAL
  Filled 2021-02-20: qty 1

## 2021-02-20 MED ORDER — LOPERAMIDE HCL 2 MG PO CAPS: 2.0000 mg | ORAL_CAPSULE | ORAL | 0 refills | Status: AC | PRN

## 2021-02-20 NOTE — Progress Notes (Signed)
Pt eager to return home. States she feels back to her baseline. Needing assistance for posterior pericare, educated in use of tongs to extend her reach. Instructed in energy conservation strategies with pt verbalizing understanding.    02/20/21 1435  OT Visit Information  Last OT Received On 02/20/21  Assistance Needed +1  History of Present Illness 50 y.o. female  transfer from [place] with ARF after 1 week of variable Glc on her monitor and 3 days of decreased po. Admitted to [facility] 02/14/21 for emergent HD for treatment of acute renal failure and hyperkalemia.with a past medical history significant for HTN, DM2, obesity, CKD 3B,  Precautions  Precautions Fall  Pain Assessment  Pain Assessment No/denies pain  Cognition  Arousal/Alertness Awake/alert  Behavior During Therapy WFL for tasks assessed/performed  Overall Cognitive Status Within Functional Limits for tasks assessed  General Comments pt stating she feels back to normal  ADL  Overall ADL's  Needs assistance/impaired  Grooming Wash/dry hands;Set up;Sitting  Toilet Transfer Ambulation;BSC;Supervision/safety  Toileting- Clothing Manipulation and Hygiene Total assistance;Sit to/from stand  General ADL Comments Pt eager to go home. She has been routinely using BSC, but needing assist for posterior pericare due to diarrhea. Educated in use of tongs for [employer].  Bed Mobility  General bed mobility comments pt received in chair  Balance  Overall balance assessment Needs assistance  Sitting balance-Leahy Scale Good  Standing balance support Bilateral upper extremity supported  Standing balance-Leahy Scale Poor  Transfers  Overall transfer level Needs assistance  Equipment used Rolling walker (2 wheeled)  Transfers Sit to/from Stand  Sit to Stand Supervision  General transfer comment supervision for safety  OT - End of Session  Equipment Utilized During Treatment Rolling walker  Activity Tolerance Patient tolerated treatment well   Patient left in chair;with call bell/phone within reach  Nurse Communication Other (comment) (ok to power down alarming IV)  OT Assessment/Plan  OT Plan Discharge plan needs to be updated  OT Visit Diagnosis Unsteadiness on feet (R26.81);Other abnormalities of gait and mobility (R26.89);Muscle weakness (generalized) (M62.81)  OT Frequency (ACUTE ONLY) Min 2X/week  Follow Up Recommendations No OT follow up (Pt is declining post acute OT.)  OT Equipment None recommended by OT  AM-PAC OT "6 Clicks" Daily Activity Outcome Measure (Version 2)  Help from another person eating meals? 4  Help from another person taking care of personal grooming? 3  Help from another person toileting, which includes using toliet, bedpan, or urinal? 2  Help from another person bathing (including washing, rinsing, drying)? 2  Help from another person to put on and taking off regular upper body clothing? 3  Help from another person to put on and taking off regular lower body clothing? 2  6 Click Score 16  OT Goal Progression  Progress towards OT goals Progressing toward goals  Acute Rehab OT Goals  Patient Stated Goal go home today  OT Goal Formulation With patient  Time For Goal Achievement 03/03/21  Potential to Achieve Goals Good  OT Time Calculation  OT Start Time (ACUTE ONLY) 1355  OT Stop Time (ACUTE ONLY) 1410  OT Time Calculation (min) 15 min  OT General Charges  $OT Visit 1 Visit  OT Treatments  $Self Care/Home Management  8-22 mins  Nestor Lewandowsky, OTR/L Acute Rehabilitation Services Pager: [phone] Office: [phone]

## 2021-02-20 NOTE — Discharge Instructions (Signed)
Acute Kidney Injury, Adult  Acute kidney injury is a sudden worsening of kidney function. The kidneys are organs that have several jobs. They filter the blood to remove waste products and extra fluid. They also maintain a healthy balance of minerals and hormones in the body, which helps control blood pressure and keep bones strong. With this condition, your kidneys do not do their jobs as well as they should. This condition ranges from mild to severe. Over time, it may develop into long-lasting (chronic) kidney disease. Early detection and treatment may prevent acute kidney injury from developing into a chronic condition. What are the causes? Common causes of this condition include:  A problem with blood flow to the kidneys. This may be caused by: ? Low blood pressure (hypotension) or shock. ? Blood loss. ? Heart and blood vessel (cardiovascular) disease. ? Severe burns. ? Liver disease.  Direct damage to the kidneys. This may be caused by: ? Certain medicines. ? A kidney infection. ? Poisoning. ? Being around or in contact with toxic substances. ? A surgical wound. ? A hard, direct hit to the kidney area.  A sudden blockage of urine flow. This may be caused by: ? Cancer. ? Kidney stones. ? An enlarged prostate in males. What increases the risk? You are more likely to develop this condition if you:  Are older than age 8.  Are female.  Are hospitalized, especially if you are in critical condition.  Have certain conditions, such as: ? Chronic kidney disease. ? Diabetes. ? Coronary artery disease and heart failure. ? Pulmonary disease. ? Chronic liver disease. What are the signs or symptoms? Symptoms of this condition may not be obvious until the condition becomes severe. Symptoms of this condition can include:  Tiredness (lethargy) or difficulty staying awake.  Nausea or vomiting.  Swelling (edema) of the face, legs, ankles, or feet.  Problems with urination, such  as: ? Pain in the abdomen, or pain along the side of your stomach (flank). ? Producing little or no urine. ? Passing urine with a weak flow.  Muscle twitches and cramps, especially in the legs.  Confusion or trouble concentrating.  Loss of appetite.  Fever. How is this diagnosed? Your health care provider can diagnose this condition based on your symptoms, medical history, and a physical exam.  You may also have other tests, such as:  Blood tests.  Urine tests.  Imaging tests.  A test in which a sample of tissue is removed from the kidneys to be examined under a microscope (kidney biopsy). How is this treated? Treatment for this condition depends on the cause and how severe the condition is. In mild cases, treatment may not be needed. The kidneys may heal on their own. In more severe cases, treatment will involve:  Treating the cause of the kidney injury. This may involve changing any medicines you are taking or adjusting your dosage.  Fluids. You may need specialized IV fluids to balance your body's needs.  Having a catheter placed to drain urine and prevent blockages.  Preventing problems from occurring. This may mean avoiding certain medicines or procedures that can cause further injury to the kidneys. In some cases, treatment may also require:  A procedure to remove toxic wastes from the body (dialysis or continuous renal replacement therapy, CRRT).  Surgery. This may be done to repair a torn kidney or to remove the blockage from the urinary system. Follow these instructions at home: Medicines  Take over-the-counter and prescription medicines only  as told by your health care provider.  Do not take any new medicines without your health care provider's approval. Many medicines can worsen your kidney damage.  Do not take any vitamin and mineral supplements without your health care provider's approval. Many nutritional supplements can worsen your kidney  damage. Lifestyle  If your health care provider prescribed changes to your diet, follow them. You may need to decrease the amount of protein you eat.  Achieve and maintain a healthy weight. If you need help with this, ask your health care provider.  Start or continue an exercise plan. Try to exercise at least 30 minutes a day, 5 days a week.  Do not use any products that contain nicotine or tobacco, such as cigarettes, e-cigarettes, and chewing tobacco. If you need help quitting, ask your health care provider.   General instructions  Keep track of your blood pressure. Report changes in your blood pressure as told by your health care provider.  Stay up to date with your vaccines. Ask your health care provider which vaccines you need.  Keep all follow-up visits as told by your health care provider. This is important.   Where to find more information  American Association of Kidney Patients: BombTimer.gl  National Kidney Foundation: www.kidney.[place]: https://mathis.com/  [facility]: ? www.lifeoptions.org ? www.kidneyschool.org Contact a health care provider if:  Your symptoms get worse.  You develop new symptoms. Get help right away if:  You develop symptoms of worsening kidney disease, which include: ? Headaches. ? Abnormally dark or light skin. ? Easy bruising. ? Frequent hiccups. ? Chest pain. ? Shortness of breath. ? End of menstruation in women. ? Seizures. ? Confusion or altered mental status. ? Abdominal or back pain. ? Itchiness.  You have a fever.  Your body is producing less urine.  You have pain or bleeding when you urinate. Summary  Acute kidney injury is a sudden worsening of kidney function.  Acute kidney injury can be caused by problems with blood flow to the kidneys, direct damage to the kidneys, and sudden blockage of urine flow.  Symptoms of this condition may not be obvious until it becomes severe.  Symptoms may include edema, lethargy, confusion, nausea or vomiting, and problems passing urine.  This condition can be diagnosed with blood tests, urine tests, and imaging tests. Sometimes a kidney biopsy is done to diagnose this condition.  Treatment for this condition often involves treating the underlying cause. It is treated with fluids, medicines, diet changes, dialysis, or surgery. This information is not intended to replace advice given to you by your health care provider. Make sure you discuss any questions you have with your health care provider. Document Revised: 07/26/2019 Document Reviewed: 07/26/2019 Elsevier Patient Education  2021 [place]. Ms. Reiley, Upright were admitted to the hospital due to a severe kidney injury and dangerously high potassium levels. You required urgent dialysis in the hospital although no longer required dialysis upon discharge. Your kidney injury continues to improve and your potassium has normalized.   This injury may have acutely been caused by Bactrim, and you should avoid taking this antibiotic in the future. Your foot ulcer did not appear to be acutely infected and did not require antibiotic treatment in the hospital. Your kidney injury could also occur due to taking high doses of pain medications at home and worsened by decreased oral intake.   Your diarrhea is likely due to Reglan (metoclopramide) or a viral illness.  I stopped your reglan and started you on imodium, which you should pick up on discharge.   You were found to have anemia with vitamin B12, folate, and iron deficiencies.   Upon discharge from the hospital, please:  - STOP taking Percocet and avoid Morphine and NSAIDs (ibuprofen, Motrin, etc) - STOP taking linzess, mag-ox and reglan (at least temporarily while you are having diarrhea) as these can all contribute to your symptoms  - STOP taking lisinopril until you are able to follow up with your doctor - STOP taking vitamin  D until you can get your levels repeated by your doctor  - CHANGE your doses of the following medications to the following: --- Lasix '40mg'$  once daily  --- Lyrica '75mg'$  twice daily  --- Omeprazole '40mg'$  twice daily  - START folic acid, 2 tablets daily  - START imodium, 1 tablet after each episode of diarrhea, up to 8 tablets per day  - START Dilaudid 1 tablet up to every 8 hours only as needed for severe pain   You were given vitamin B12 shots and will need to follow up with your primary care doctor to continue receiving these once you leave the hospital. Please continue taking your other medications listed as prescribed including your Omnipod.   Please call Dr. Jannette Fogo to see if you can get an appointment sooner than 01/05/21 (1 week would be ideal) although otherwise please be sure to attend your appointment. I have also scheduled you with a nephrology appointment at 12pm at [facility]. Please call to reschedule if you are not able to make your appointment. Please ask your primary care doctor for a referral to PT (instructions in your discharge paperwork). I have ordered a walker, tub/shower seat/bench, and a wheelchair to be delivered to your home.   If you experience persistent diarrhea that extends past 2 weeks, uncontrolled abdominal pain or vomiting, decreased urination, fevers, chills, increased drainage or redness around your foot wound, worsening swelling, chest pain, shortness of breath or any other concerns, please call your primary care doctor or return to the ED.   It was great caring for you and I hope you continue to feel better,  Dr. Konrad Penta

## 2021-02-20 NOTE — Progress Notes (Signed)
Pt given discharge instructions, prescriptions, and care notes. Pt verbalized understanding AEB no further questions or concerns at this time. Pt awaiting PTAR for transport home.

## 2021-02-20 NOTE — Progress Notes (Signed)
Nephrology Follow-Up note   Assessment/Recommendations: Emma Patton is a/an 50 y.o. female with a past medical history significant for HTN, DM2, obesity, CKD 3B, admitted for acute renal failure and hyperkalemia.     Non-Oliguric severe AKI on ckd:  # Severe AKI with creatinine up to 7.9 from baseline of 1.5-2.  Unclear what precipitated her AKI but likely combination of medications including Bactrim, Lasix, ARB along with possible hypotensive episodes.  Similar episode in the past attributed to same. Obstruction may have contributed but no obvious hydronephrosis on renal ultrasound.  Her urine is bland arguing against any glomerular disease. - stop NS - nontunneled dialysis catheter is out  - foley catheter is out   - strict ins/outs ordered - Please hold lisinopril now and on discharge  - Can resume lasix at reduced dose of 40 mg daily on 5/26 from my standpoint  - daily weights while here   Severe hyperkalemia: Significantly improved with medical therapy and resolved with dialysis.  Continue to monitor off of HD  Hypertension: Some hypotension noted and per charting amlodipine stopped.  Normotensive now.  Avoid hypotension.   Anemia macrocytic - iron deficient - s/p feraheme 510 mg IV once on 5/23.  B12 low - repletion per primary team.   Uncontrolled Diabetes Mellitus Type 2 with Hyperglycemia: Management per primary team  Hyperphosphatemia: Associated with renal failure.  Resolved with HD.  Hyponatremia: Resolved with dialysis.   Disposition - stable from a nephrology standpoint.  Nephrology will sign off.  She states has appt with her PCP on 6/9 and she is calling office to set up nephrology appt (had appt to establish care with nephrology in [facility] but missed it because was admitted).   ________________________________________________________  Interval History/Subjective:  Strict ins/out not available.  Had 600 mL UOP over 5/24 and an unmeasured void.  Last HD on  5/21 with no UF/kept even.  She was to set up care with nephrology in [facility] but she missed it because ended up admitted here.  She states is closer to her home and she feels more comfortable commuting/driving to [place] than [place].  She states she has been on lasix for several years.   Review of systems:    Denies shortness of breath  Denies chest pain  No n/v; hx diarrhea   Medications:  Current Facility-Administered Medications  Medication Dose Route Frequency Provider Last Rate Last Admin  . 0.9 %  sodium chloride infusion   Intravenous Continuous Claudia Desanctis, MD 75 mL/hr at 02/19/21 1728 New Bag at 02/19/21 1728  . acetaminophen (TYLENOL) tablet 650 mg  650 mg Oral Q6H PRN Aslam, Loralyn Freshwater, MD       Or  . acetaminophen (TYLENOL) suppository 650 mg  650 mg Rectal Q6H PRN Aslam, Sadia, MD      . atorvastatin (LIPITOR) tablet 10 mg  10 mg Oral Daily Aslam, Sadia, MD   10 mg at 02/20/21 0929  . Chlorhexidine Gluconate Cloth 2 % PADS 6 each  6 each Topical Daily Campbell Riches, MD   6 each at 02/20/21 [phone]  . Chlorhexidine Gluconate Cloth 2 % PADS 6 each  6 each Topical Q0600 Rosita Fire, MD   6 each at 02/20/21 [phone]  . cyanocobalamin ((VITAMIN B-12)) injection 1,000 mcg  1,000 mcg Intramuscular Daily Jeralyn Bennett, MD   1,000 mcg at 02/20/21 [phone]  . enoxaparin (LOVENOX) injection 80 mg  80 mg Subcutaneous Q24H Pham, Minh Q, RPH-CPP   80  mg at 02/20/21 0940  . folic acid (FOLVITE) tablet 5 mg  5 mg Oral Daily Jeralyn Bennett, MD   5 mg at 02/20/21 0929  . guaiFENesin (ROBITUSSIN) 100 MG/5ML solution 100 mg  5 mL Oral Q4H PRN Sanjuan Dame, MD      . HYDROmorphone (DILAUDID) tablet 2 mg  2 mg Oral Q3H PRN Harvie Heck, MD   2 mg at 02/20/21 0501  . insulin aspart (novoLOG) injection 0-20 Units  0-20 Units Subcutaneous TID WC Harvie Heck, MD   4 Units at 02/19/21 1727  . insulin glargine (LANTUS) injection 9 Units  9 Units Subcutaneous QHS Jeralyn Bennett, MD    9 Units at 02/19/21 2126  . loperamide (IMODIUM) capsule 2 mg  2 mg Oral PRN Jeralyn Bennett, MD   2 mg at 02/20/21 [id number]  . metoCLOPramide (REGLAN) tablet 10 mg  10 mg Oral TID PRN Harvie Heck, MD   10 mg at 02/18/21 1019  . pantoprazole (PROTONIX) EC tablet 40 mg  40 mg Oral BID AC Aslam, Loralyn Freshwater, MD   40 mg at 02/20/21 0931  . pregabalin (LYRICA) capsule 75 mg  75 mg Oral BID Aslam, Sadia, MD      . sodium chloride flush (NS) 0.9 % injection 10-40 mL  10-40 mL Intracatheter PRN Campbell Riches, MD   10 mL at 02/19/21 0403  . sodium chloride flush (NS) 0.9 % injection 10-40 mL  10-40 mL Intracatheter Q12H Narendra, Nischal, MD      . sodium chloride flush (NS) 0.9 % injection 3 mL  3 mL Intravenous Q12H Aslam, Sadia, MD   3 mL at 02/20/21 0940  . zolpidem (AMBIEN) tablet 5 mg  5 mg Oral QHS PRN Harvie Heck, MD   5 mg at 02/18/21 2128      Physical Exam: Vitals:   02/19/21 1115 02/20/21 0435  BP: 134/75 (!) 148/52  Pulse: 61 (!) 58  Resp: 18 20  Temp: 98 F (36.7 C) 98.1 F (36.7 C)  SpO2: 96% 97%   No intake/output data recorded.  Intake/Output Summary (Last 24 hours) at 02/20/2021 1224 Last data filed at 02/20/2021 0300 Gross per 24 hour  Intake 1585 ml  Output --  Net 1585 ml   General adult female in bed in no acute distress  HEENT normocephalic atraumatic extraocular movements intact sclera anicteric Neck supple trachea midline Lungs clear to auscultation bilaterally normal work of breathing at rest  Heart S1S2 no rub Abdomen soft nontender nondistended Extremities left leg 1+ edema; no right leg edema  Psych normal mood and affect Neuro alert and oriented x3 provides hx and follows commands Access RIJ nontunneled catheter is out; bandage over site is clean GU no foley   Test Results I personally reviewed new and old clinical labs and radiology tests Lab Results  Component Value Date   NA 139 02/20/2021   K 3.9 02/20/2021   CL 114 (H) 02/20/2021   CO2 22  02/20/2021   BUN 50 (H) 02/20/2021   CREATININE 2.82 (H) 02/20/2021   CALCIUM 8.2 (L) 02/20/2021   ALBUMIN 2.6 (L) 02/20/2021   PHOS 3.6 02/20/2021     Claudia Desanctis, MD 02/20/2021 12:48 PM

## 2021-02-20 NOTE — Plan of Care (Signed)
  Problem: Health Behavior/Discharge Planning: Goal: Ability to manage health-related needs will improve Outcome: Progressing   Problem: Clinical Measurements: Goal: Will remain free from infection Outcome: Progressing Goal: Diagnostic test results will improve Outcome: Progressing Goal: Cardiovascular complication will be avoided Outcome: Progressing   Problem: Activity: Goal: Risk for activity intolerance will decrease Outcome: Progressing   Problem: Elimination: Goal: Will not experience complications related to bowel motility Outcome: Progressing

## 2021-02-20 NOTE — Care Management (Signed)
Patient requests PTAR transport home.  She states that she is able to walk stairs. She does not require oxygen. She refuses for spouse to provide transport, as well as to go via taxi or [place] [facility] stating "I am too big to fit in one of those tiny cars." Informed that non-emergency transport would likely not be covered, and she continues to request it regardless. PTAR called.

## 2021-02-20 NOTE — Progress Notes (Signed)
Subjective:   No acute events overnight.   This morning, Emma Patton is doing okay, inquiring about discharge and medications. She does mention the diarrhea has worsened. Her diarrhea started while in the hospital after she was given laxatives and Linzess early in her stay. She had two doses of Imodium and another this morning. Since yesterday night reports 7-8 BM. Says her diarrhea is watery with food particles but is brown without blood. She says she is breathing well without the oxygen, her LE swelling has remained at her baseline, but has had severe neuropathy with the decrease in her Lyrica dose yesterday to once daily. She says she will not be able to tolerate less frequent doses of this.   Objective:  Vital signs in last 24 hours: Vitals:   02/18/21 2008 02/19/21 0521 02/19/21 1115 02/20/21 0435  BP: 133/60 130/62 134/75 (!) 148/52  Pulse: (!) 56 (!) 56 61 (!) 58  Resp: '18 19 18 20  '$ Temp: 98 F (36.7 C) 97.9 F (36.6 C) 98 F (36.7 C) 98.1 F (36.7 C)  TempSrc: Oral Oral Oral Oral  SpO2: 98% 97% 96% 97%  Weight:      Height:       CMP Latest Ref Rng & Units 02/20/2021 02/19/2021 02/18/2021  Glucose 70 - 99 mg/dL 153(H) 129(H) 112(H)  BUN 6 - 20 mg/dL 50(H) 51(H) 50(H)  Creatinine 0.44 - 1.00 mg/dL 2.82(H) 2.97(H) 3.32(H)  Sodium 135 - 145 mmol/L 139 140 138  Potassium 3.5 - 5.1 mmol/L 3.9 4.1 4.3  Chloride 98 - 111 mmol/L 114(H) 110 106  CO2 22 - 32 mmol/L '22 24 27  '$ Calcium 8.9 - 10.3 mg/dL 8.2(L) 8.3(L) 8.3(L)  Total Protein 6.5 - 8.1 g/dL - - -  Total Bilirubin 0.3 - 1.2 mg/dL - - -  Alkaline Phos 38 - 126 U/L - - -  AST 15 - 41 U/L - - -  ALT 0 - 44 U/L - - -   CBC Latest Ref Rng & Units 02/18/2021 02/16/2021 02/15/2021  WBC 4.0 - 10.5 K/uL 8.5 8.3 9.1  Hemoglobin 12.0 - 15.0 g/dL 8.6(L) 8.0(L) 8.3(L)  Hematocrit 36.0 - 46.0 % 27.2(L) 25.0(L) 27.4(L)  Platelets 150 - 400 K/uL 292 296 333   Physical Exam  Constitutional: Extremely obese. Sitting in recliner  comfortably in no acute distress. HENT: MMM. No conjunctival injection.  Cardiovascular: Rate is slightly bradycardic. Rhythm is regular. No m/r/g. There is trace edema of the RLE and 2+ pitting edema over the left lower leg (stable).  Respiratory: Lungs are CTA bilaterally. She has borderline tachypnea although is saturating in the high 90's on room air.  Abdominal: Soft, non-distended and not tender to palpation. No guarding or rebound. Bowel sounds are normal throughout.  Neurological: Awake, alert, and oriented x 3. No focal neurological deficits or asterixis noted.  MSK: Normal muscle tone, diffusely decreased muscle bulk. No LE tenderness.  Neurological: Is alert and oriented x4, no apparent focal deficits or asterixis noted.  Psych: Pleasant and cooperative.   Assessment/Plan:  Active Problems:   Diabetes mellitus type 2 with complications, uncontrolled (HCC)   Acute on chronic renal failure (HCC)   Hyperkalemia   Encounter for central line placement  Emma Patton is a 50 year old female with PMHx of IBS, uncontrolled type II diabetes mellitus, chronic osteomyelitis of the spine, chronic back pain, extreme obesity, and paroxysmal atrial fibrillation admitted with acute on chronic renal failure with severe hyperkalemia.  Acute on chronic renal failure, Improved  Hyperkalemia, resolved Likely due to polypharmacy (bactrim, lasix, ARB) and possible hypotension. Creatinine continues to improve. Potassium remains normal. Her last HD session was 02/16/21 and her dialysis catheter was removed yesterday. Her mental status has returned to her baseline and she feels that her LE swelling is stable since admission.  - Nephrology has signed off, appreciate their assistance   - Voiding well s/p catheter removal  - Discontinued IVF's - Strict I&O while admitted  - Will discharge with reduced dose of Lasix '40mg'$  once daily, starting tomorrow  - Will continue to hold home lisinopril (and  on discharge)   Bradycardia, Intermittent  Non-Sustained VT w/ PVC's, Improved Patient continues to have intermittent sinus bradycardia, down to the 50's with less frequent PVC's despite her Lopressor being held this morning due to slow HR. Remains asymptomatic.   - Will discontinue Lopressor  - Consider restarting outpatient if she develops symptoms / more frequent PVC's  Chronic anemia with vitamin B12, folate and iron deficiencies Hgb is stable without signs of bleeding. Received feraheme IV x 1 and three B12 IM injections  - Continue folic acid '5mg'$  daily on discharge  - Continue weekly vitamin B12 injections as outpatient  - Will require follow up as outpatient   IBS Acute Diarrhea Patient had endorsed constipation leading up to her hospital stay until she was given laxatives and Linzess here in the hospital. Since that time she's had persistent watery, non-bloody diarrhea. She says this is atypical for her IBS. C. Diff testing was negative and she has remained afebrile without a WBC. It is highly likely this is a self-limited virus.  - Treat supportively with Imodium '2mg'$  PRN here and upon discharge  - Discontinued constipation medications - Consider outpatient follow up if this doesn't resolve in a week   Type II DM Sugars remain well controlled on Lantus 9U nightly and SSI. - Continue SSI while admitted  - Will start home medications on discharge  - CBG monitoring   Chronic pain syndrome Takes percocet 10-'325mg'$  q8 hours scheduled at home although recommend dilaudid upon discharge due to AKI.  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   - Will hold Lasix today and discharge with reduced dose ('40mg'$  PO daily)  - Wound care of R foot ulcer per [facility] RN  recommendations - continue on discharge  Diet: Renal / CM w/ 12105m fluid restriction Fluids: None DVT Prophylaxis: Lovenox  Code status: FULL  Prior to Admission Living Arrangement: Home  Anticipated Discharge Location: Home w/ Outpatient PT/OT (refused HH) Barriers to Discharge: Ambulatory pulse ox to see if she will be requiring O2 on discharge; otherwise none - anticipate discharge this afternoon   SJeralyn Bennett MD  [facility] PGY-1 02/20/2021, 1:52 PM Pager:[phone] After 5pm on weekdays and 1pm on weekends: On Call pager [phone]

## 2021-02-20 NOTE — Progress Notes (Signed)
Physical Therapy Treatment Patient Details Name: Emma Patton MRN: [id number] DOB: [birth year] Today's Date: 02/20/2021    History of Present Illness 50 y.o. female  transfer from [place] with ARF after 1 week of variable Glc on her monitor and 3 days of decreased po. Admitted to [facility] 02/14/21 for emergent HD for treatment of acute renal failure and hyperkalemia.with a past medical history significant for HTN, DM2, obesity, CKD 3B,    PT Comments    Patient received in recliner, very pleasant and excited about possibly going home later today. Able to mobilize and progress gait on a min guard basis with RW with good awareness of safety and energy conservation principles during gait. Good recall of conversations with PT yesterday. Continues to fatigue easily, and would still benefit from a WC for community distances. Left up in recliner with all needs met this afternoon.     Follow Up Recommendations  Outpatient PT;Supervision for mobility/OOB     Equipment Recommendations  Rolling walker with 5" wheels;Other (comment);Wheelchair cushion (measurements PT);Wheelchair (measurements PT) (shower bench; all bariatric equipment)    Recommendations for Other Services       Precautions / Restrictions Precautions Precautions: Fall Restrictions Weight Bearing Restrictions: No    Mobility  Bed Mobility               General bed mobility comments: pt received in chair    Transfers Overall transfer level: Needs assistance Equipment used: Rolling walker (2 wheeled) Transfers: Sit to/from Stand Sit to Stand: Min guard         General transfer comment: Min guard for safety and line management, cues for hand placement and eccentric control with stand to sit  Ambulation/Gait Ambulation/Gait assistance: Min guard Gait Distance (Feet): 45 Feet Assistive device: Rolling walker (2 wheeled) Gait Pattern/deviations: Decreased step length - right;Decreased step length - left;Trunk  flexed;Wide base of support;Step-through pattern Gait velocity: decreased   General Gait Details: slow and steady with RW but still easily fatigued. Good awareness of fatiuge and personal safety/energy management with gait   Stairs             Wheelchair Mobility    Modified Rankin (Stroke Patients Only)       Balance Overall balance assessment: Needs assistance Sitting-balance support: Feet supported;No upper extremity supported Sitting balance-Leahy Scale: Good     Standing balance support: Bilateral upper extremity supported Standing balance-Leahy Scale: Poor Standing balance comment: Reliant on BUE on RW.                            Cognition Arousal/Alertness: Awake/alert Behavior During Therapy: WFL for tasks assessed/performed Overall Cognitive Status: Within Functional Limits for tasks assessed                                 General Comments: pt reports she is not at her baseline cognitively- but responds and follows cues appropriately      Exercises      General Comments        Pertinent Vitals/Pain Pain Assessment: No/denies pain Faces Pain Scale: No hurt Pain Intervention(s): Limited activity within patient's tolerance;Monitored during session    Home Living                      Prior Function            PT Goals (current goals  can now be found in the care plan section) Acute Rehab PT Goals Patient Stated Goal: be more independent PT Goal Formulation: With patient Time For Goal Achievement: 03/02/21 Potential to Achieve Goals: Fair Progress towards PT goals: Progressing toward goals    Frequency    Min 4X/week      PT Plan Current plan remains appropriate    Co-evaluation              AM-PAC PT "6 Clicks" Mobility   Outcome Measure  Help needed turning from your back to your side while in a flat bed without using bedrails?: A Little Help needed moving from lying on your back to  sitting on the side of a flat bed without using bedrails?: A Little Help needed moving to and from a bed to a chair (including a wheelchair)?: A Little Help needed standing up from a chair using your arms (e.g., wheelchair or bedside chair)?: A Little Help needed to walk in hospital room?: A Little Help needed climbing 3-5 steps with a railing? : A Little 6 Click Score: 18    End of Session Equipment Utilized During Treatment: Gait belt Activity Tolerance: Patient tolerated treatment well Patient left: in chair;with call bell/phone within reach Nurse Communication: Mobility status PT Visit Diagnosis: Unsteadiness on feet (R26.81);Other abnormalities of gait and mobility (R26.89);Muscle weakness (generalized) (M62.81);Difficulty in walking, not elsewhere classified (R26.2);Pain Pain - Right/Left: Left Pain - part of body: Leg     Time: 5916-3846 PT Time Calculation (min) (ACUTE ONLY): 13 min  Charges:  $Gait Training: 8-22 mins                     Windell Norfolk, DPT, PN1   Supplemental Physical Therapist [place]    Pager [phone] Acute Rehab Office [phone]

## 2021-02-20 NOTE — Progress Notes (Signed)
SATURATION QUALIFICATIONS: (This note is used to comply with regulatory documentation for home oxygen)  Patient Saturations on Room Air at Rest = 98%  Patient Saturations on Room Air while Ambulating = 95%  Please briefly explain why patient needs home oxygen: N/A

## 2021-02-20 NOTE — Discharge Summary (Signed)
Name: Emma Patton MRN: [id number] DOB: 28-Aug-1971 50 y.o. PCP: Pcp, No  Date of Admission: 02/14/2021 11:51 AM Date of Discharge: 02/20/2021 Attending Physician: Dr. Aldine Contes Discharge Diagnosis: 1. Severe AKI (on CKD)  2. Hyperkalemia (Resolved)  3. Asymptomatic Bradycardia w/ PVC's  4. Acute Diarrhea  5. Chronic Anemia w/ B12, Folate, and Iron Deficiencies  6. Chronic Pain Syndrome  7. Non-Infected Diabetic (IDDM Type II) Foot Wound  Discharge Medications: Allergies as of 02/20/2021      Reactions   Penicillins Hives, Rash   Bactrim [sulfamethoxazole-trimethoprim] Other (See Comments)   MD mentioned kidney stress      Medication List    STOP taking these medications   ferrous sulfate 325 (65 FE) MG tablet   linaclotide 145 MCG Caps capsule Commonly known as: LINZESS   lisinopril 20 MG tablet Commonly known as: ZESTRIL   magnesium oxide 400 (240 Mg) MG tablet Commonly known as: MAG-OX   metoCLOPramide 10 MG tablet Commonly known as: REGLAN   oxyCODONE-acetaminophen 10-325 MG tablet Commonly known as: PERCOCET   Vitamin D (Ergocalciferol) 1.25 MG (50000 UNIT) Caps capsule Commonly known as: DRISDOL     TAKE these medications   amLODipine 10 MG tablet Commonly known as: NORVASC Take 10 mg by mouth every morning. What changed: Another medication with the same name was removed. Continue taking this medication, and follow the directions you see here.   atorvastatin 10 MG tablet Commonly known as: LIPITOR Take 10 mg by mouth at bedtime.   cetirizine 10 MG tablet Commonly known as: ZYRTEC Take 10 mg by mouth every morning.   fenofibrate micronized 134 MG capsule Commonly known as: LOFIBRA Take 134 mg by mouth every morning.   folic acid 1 MG tablet Commonly known as: FOLVITE Take 2 tablets (2 mg total) by mouth daily.   furosemide 40 MG tablet Commonly known as: LASIX Take 1 tablet (40 mg total) by mouth daily. What changed: when to  take this   HYDROmorphone 2 MG tablet Commonly known as: DILAUDID Take 1 tablet (2 mg total) by mouth every 8 (eight) hours as needed for severe pain.   loperamide 2 MG capsule Commonly known as: IMODIUM Take 1 capsule (2 mg total) by mouth as needed for diarrhea or loose stools. You may take up to 8 tablets in one day.   omeprazole 20 MG capsule Commonly known as: PRILOSEC Take 2 capsules (40 mg total) by mouth 2 (two) times daily. What changed:   how much to take  when to take this  reasons to take this   Omnipod Classic PDM (Gen 3) Kit by Does not apply route See admin instructions. Use with Lispro insulin   ondansetron 4 MG tablet Commonly known as: ZOFRAN Take 4 mg by mouth every 8 (eight) hours as needed for nausea or vomiting.   pregabalin 75 MG capsule Commonly known as: LYRICA Take 1 capsule (75 mg total) by mouth 2 (two) times daily. What changed: when to take this   sitaGLIPtin 25 MG tablet Commonly known as: JANUVIA Take 25 mg by mouth every morning.   zolpidem 10 MG tablet Commonly known as: AMBIEN Take 0.5 tablets (5 mg total) by mouth at bedtime as needed for sleep. What changed: when to take this            Discharge Care Instructions  (From admission, onward)         Start     Ordered   02/20/21 0000  Discharge  wound care:       Comments: Please keep foot ulcer clean and dry.   02/20/21 1613        Discharge Instructions:  Dear Emma Patton,   You were admitted to the hospital due to a severe kidney injury and dangerously high potassium levels. You required urgent dialysis in the hospital although no longer required dialysis upon discharge. Your kidney injury continues to improve and your potassium has normalized.   This injury may have acutely been caused by Bactrim, and you should avoid taking this antibiotic in the future. Your foot ulcer did not appear to be acutely infected and did not require antibiotic treatment in the  hospital. Your kidney injury could also occur due to taking high doses of pain medications at home and worsened by decreased oral intake.   Your diarrhea is likely due to Reglan (metoclopramide) or a viral illness. I stopped your reglan and started you on imodium, which you should pick up on discharge.   You were found to have anemia with vitamin B12, folate, and iron deficiencies.   Upon discharge from the hospital, please:  - STOP taking Percocet and avoid Morphine and NSAIDs (ibuprofen, Motrin, etc) - STOP taking linzess, mag-ox and reglan (at least temporarily while you are having diarrhea) as these can all contribute to your symptoms  - STOP taking lisinopril until you are able to follow up with your doctor - STOP taking vitamin D until you can get your levels repeated by your doctor  - CHANGE your doses of the following medications to the following: --- Lasix $RemoveB'40mg'bsbxoYxp$  once daily  --- Lyrica $RemoveBe'75mg'QJczfKEft$  twice daily  --- Omeprazole $RemoveBefore'40mg'fcerrpEdcqvVb$  twice daily  - START folic acid, 2 tablets daily  - START imodium, 1 tablet after each episode of diarrhea, up to 8 tablets per day  - START Dilaudid 1 tablet up to every 8 hours only as needed for severe pain   You were given vitamin B12 shots and will need to follow up with your primary care doctor to continue receiving these once you leave the hospital. Please continue taking your other medications listed as prescribed including your Omnipod.   Please call Dr. Jannette Fogo to see if you can get an appointment sooner than 01/05/21 (1 week would be ideal) although otherwise please be sure to attend your appointment. I have also scheduled you with a nephrology appointment at 12pm at [facility]. Please call to reschedule if you are not able to make your appointment. Please ask your primary care doctor for a referral to PT (instructions in your discharge paperwork). I have ordered a walker, tub/shower seat/bench, and a wheelchair to be delivered to your home.    If you experience persistent diarrhea that extends past 2 weeks, uncontrolled abdominal pain or vomiting, decreased urination, fevers, chills, increased drainage or redness around your foot wound, worsening swelling, chest pain, shortness of breath or any other concerns, please call your primary care doctor or return to the ED.   It was great caring for you and I hope you continue to feel better,  Dr. Konrad Penta  Disposition and follow-up:   Emma Patton was discharged from [facility] in Stable condition.  At the hospital follow up visit please address:  Please Follow Up:   1. Severe AKI (on CKD) w/ Hyperkalemia - Repeat labs and avoid any future use of Bactrim. Held lisinopril during admission and discharge - add back if blood pressures will allow.    2.  Asymptomatic Bradycardia w/ PVC's - Continue to monitor for symptoms. 3. Acute Diarrhea - Presumed viral in nature vs. Medication-induced. Stopped constipation medications and Reglan; monitor frequency on imodium and consider chronic diarrhea workup if persistent.  4. Chronic Anemia w/ B12, Folate, and Iron Deficiencies - Received single iron transfusion, B12 injections, and PO folate replacement in the hospital. Will require ongoing monitoring.  5. Chronic Pain Syndrome - Pain medications were switched to PO dilaudid on discharge. Assess pain levels, consider referral to pain management, and recommend avoiding more renally-cleared opioids moving forward given CKD.  6. Non-Infected Diabetic (IDDM Type II) Foot Wound - Wound did not appear to be actively infected during admission. No ABX were indicated and her blood sugars were well-controlled on Lantus 9 units nightly and SSI. Discharged with omnipods although notes highly variable sugars. Both wound and sugars will need close monitoring.  7. Hx of Vitamin D Deficiency - Patient had been on high dose vitamin D that was discontinued on discharge; please repeat  vitamin D level.  8. Chronic LE edema - Patient had been on Lasix (said $RemoveBef'80mg'SAvYPzVDre$  BID, prescribed $RemoveBeforeD'40mg'RqyfjKUfyxbcma$  BID) PTA which was decreased to Lasix $Remove'40mg'NISmVYH$  daily at discharge. Assess fluid status and monitor weights.  9. Diabetic Neuropathy - Lyrica dose was decreased from TID to $Remo'75mg'aFLGe$  BID due to concern for renal injury. Consider increasing vs. Trying alternative agent once renal function stabilizes.   2.  Labs / imaging needed at time of follow-up: RFP, CBC, CBG/CGM, vitamin D-25OH level, eventual B12, folate, and iron studies, and consideration of further stool studies.   3.  Pending labs/ test needing follow-up: None   Follow-up Appointments:  Follow-up Information    Hague, Rosalyn Charters, MD. Schedule an appointment as soon as possible for a visit.   Specialty: Internal Medicine Why: Please request an outpatient physical therapy referral from Dr. Jannette Fogo at [facility] as [facility] does not have any therapy locations in [facility]. Please call to see if you may be seen earlier than 01/05/21 (in about 1 week) although OK if not. Contact information: [address] [place] [address] [phone]        [facility]. Call.   Why: Please call to find out if they can transport you to dialysis and medical appointments.  Contact information: [phone]       Jolee Ewing, MD. Go on 03/14/2021.   Specialty: Specialist Why: You have been scheduled for an appointment with Dr. Willene Hatchet 03/14/21 at 12:00pm. It is very important that you attend this appointment or call to reschedule if needed. Contact information: [place] [address] [phone]               Hospital Course by problem list:  Emma Patton is a 50 year old female with PMHx of IBS, uncontrolled type II diabetes mellitus, chronic osteomyelitis of the spine, chronic back pain, extreme obesity, and paroxysmal atrial fibrillation admitted with acute on chronic renal  failure and severe hyperkalemia.    Acute on Chronic Renal Failure  Hyperkalemia Patient presented to [facility] ED as a transfer from [place] with several days of dyspnea, fatigue, and decreased urination with severe AKI on CKD (initial sCr 7.9, baseline 1.54) with significantly elevated potassium of 7.7 s/p initial therapy at outside facility. She had been admitted multiple times in the past for the same, at the time due to opioid overdose and hypotension, although this time thought to be exacerbated by Bactrim use for  presumed LE cellulitis a couple of days prior to admission with decreased PO intake. She was given IV calcium gluconate, insulin w/ D50, Lasix, and Lokelma. U/A was remarkable for possible rhabdomyolysis and she was given 1.5L bolus IVF. Renal ultrasound was unremarkable. Nephrology were consulted. She ended up failing medaical management and received urgent dialysis (w/o prior access) 5/20 and again 5/21 with significant improvement in renal function and resolution in hyperkalemia. She reports taking Lasix 54m BID (prescribed 418mBID in chart) and benazepril-HCTZ as well as Percocet for chronic pain and Lyrica 7573mID for neuropathy. On discharge, her Lasix was decreased to 17m67mily, ACE/ARB/HCTZ were held due to fear of worsening kidney injury, opioids were switched to Dilaudid, and Lyrica was decreased to 75mg52mce daily (did not tolerate once daily) to avoid further kidney injury. Renal function continued to improve at the time of discharge and dialysis access was removed. She was scheduled for follow up with nephrology.   Bradycardia, Intermittent  Non-Sustained VT w/ PVC's Patient was on continuous telemetry monitoring and intermittently was bradycardic into the 50's although asymptomatic. Her Lopressor was initially held due to slow rates, although she developed a few short-lived episodes of asymptomatic non-sustained VT and PVC's. She was restarted on Lopressor, rates remained  persistently low, and she tolerated a second trial off Lopressor prior to discharge well without return of VT/frequent PVC's. Lopressor was held at discharge.   Chronic anemia with vitamin B12, folate and iron deficiencies Hgb remained stably low ~8-9 without signs of bleeding. She was found to have deficiencies noted above and received a single IV feraheme injection, three B12 IM injections, and was treated with PO folic acid 5mg d56my in the hospital and discharged with folic acid 2mg PO39mily. Will require ongoing outpatient monitoring.  IBS Acute Diarrhea Patient had endorsed constipation leading up to her hospital stay until she was given laxatives and Linzess here in the hospital. Since that time she had persistent watery, non-bloody diarrhea which she says she said was atypical for her IBS. C. Diff testing was negative and she has remained afebrile without a WBC. Her constipation medications as well as Reglan were discontinued and she was started on imodium prior to discharge (sent home with this) with some improvement in symptoms. Thought to be viral in nature.   Type II DM Patient noted that she has used omnipods at home, although her sugars tend to be highly variable at home. In the hospital, sugars remained stable overall on Lantus 9U nightly and SSI with carb-modified diet. Discharged on home medications.   Chronic pain syndrome Patient endorsed taking pain medications at home only as prescribed (percocet 10-325mg q860mrs scheduled). She was discharged with PO dilaudid based on her in-hospital requirements. Will require close monitoring and follow up.   Chronic bilateral lower extremity edema Non-Infected R foot DM foot wound  Patient had been treated by PCP with Bactrim PTA for presumed infection of her LE. She did have a chronic-appearing ~1x1 inch ulcer over the ball of her right foot as well as mild erythema and localized swelling over her left shin; however, both appeared to be  chronic, unchanged per patient, and she remained afebrile without leukocytosis. These did not appear to be infected and were not treated with antibiotics. Her Lasix was held during admission due to concern for pre-renal injury, and she was discharged on reduced dose (17mg PO 55my) on discharge. Wound care performed dressing changes on her right foot wound while admitted.  Discharge Exam:   BP (!) 143/68 (BP Location: Right Arm)   Pulse (!) 52   Temp 97.9 F (36.6 C)   Resp 18   Ht 6' (1.829 m)   Wt (!) 177.8 kg   LMP 02/10/2021   SpO2 98%   BMI 53.16 kg/m  Discharge exam: Please see progress note from today for full discharge subjective and objective info including PE.   Pertinent Labs, Studies, and Procedures:   RENAL / URINARY TRACT ULTRASOUND COMPLETE COMPARISON:  None. FINDINGS: Right Kidney: Renal measurements: 11.1 x 6.6 x 7.0 cm = volume: 271 mL. Echogenicity within normal limits. No mass or hydronephrosis visualized. Left Kidney: Renal measurements: 10.7 x 6.6 x 6.9 cm = volume: 259 mL. Echogenicity within normal limits. No mass or hydronephrosis visualized. Bladder: Not visualized. IMPRESSION: No significant sonographic abnormality of the kidneys. Electronically Signed   By: Miachel Roux M.D.   On: 02/14/2021 16:09  PORTABLE CHEST 1 VIEW COMPARISON:  December 23, 2019 FINDINGS: Central catheter tip is in the superior vena cava near the cavoatrial junction. No pneumothorax. There is no appreciable edema or airspace opacity. Heart is mildly enlarged with pulmonary vascularity normal. No adenopathy. No bone lesions. IMPRESSION: Central catheter as described without pneumothorax. No edema or airspace opacity. Stable cardiac prominence. Electronically Signed   By: Lowella Grip III M.D.   On: 02/15/2021 08:54  Discharge Instructions: Discharge Instructions    (HEART FAILURE PATIENTS) Call MD:  Anytime you have any of the following symptoms: 1) 3 pound  weight gain in 24 hours or 5 pounds in 1 week 2) shortness of breath, with or without a dry hacking cough 3) swelling in the hands, feet or stomach 4) if you have to sleep on extra pillows at night in order to breathe.   Complete by: As directed    Call MD for:  difficulty breathing, headache or visual disturbances   Complete by: As directed    Call MD for:  extreme fatigue   Complete by: As directed    Call MD for:  persistant dizziness or light-headedness   Complete by: As directed    Call MD for:  persistant nausea and vomiting   Complete by: As directed    Call MD for:  redness, tenderness, or signs of infection (pain, swelling, redness, odor or green/yellow discharge around incision site)   Complete by: As directed    Call MD for:  severe uncontrolled pain   Complete by: As directed    Call MD for:  temperature >100.4   Complete by: As directed    Diet - low sodium heart healthy   Complete by: As directed    Diet Carb Modified   Complete by: As directed    Discharge wound care:   Complete by: As directed    Please keep foot ulcer clean and dry.   Increase activity slowly   Complete by: As directed       Signed: Jeralyn Bennett, MD 02/25/2021, 9:54 PM   Pager: [phone]

## 2021-02-21 NOTE — Progress Notes (Signed)
Patient discharged home per orders. Discharge instructions reviewed with patient by Precious Gilding, day shift RN. Patient verbalizes understanding. IV discontinued per orders. Patient tolerated well. Belongings packed up by patient. Denies needs at this time. Patient off unit via stretcher by PTAR.

## 2021-03-14 DIAGNOSIS — E1143 Type 2 diabetes mellitus with diabetic autonomic (poly)neuropathy: Secondary | ICD-10-CM

## 2021-03-14 DIAGNOSIS — E559 Vitamin D deficiency, unspecified: Secondary | ICD-10-CM

## 2021-03-14 DIAGNOSIS — M064 Inflammatory polyarthropathy: Secondary | ICD-10-CM

## 2021-03-14 DIAGNOSIS — M25569 Pain in unspecified knee: Secondary | ICD-10-CM

## 2021-03-14 DIAGNOSIS — K5900 Constipation, unspecified: Secondary | ICD-10-CM

## 2021-03-14 DIAGNOSIS — G4700 Insomnia, unspecified: Secondary | ICD-10-CM

## 2021-03-14 DIAGNOSIS — M6281 Muscle weakness (generalized): Secondary | ICD-10-CM | POA: Insufficient documentation

## 2021-03-14 DIAGNOSIS — J309 Allergic rhinitis, unspecified: Secondary | ICD-10-CM

## 2021-03-14 DIAGNOSIS — Z89429 Acquired absence of other toe(s), unspecified side: Secondary | ICD-10-CM

## 2021-03-14 DIAGNOSIS — Z780 Asymptomatic menopausal state: Secondary | ICD-10-CM

## 2021-03-14 DIAGNOSIS — L03116 Cellulitis of left lower limb: Secondary | ICD-10-CM

## 2021-03-14 DIAGNOSIS — M47817 Spondylosis without myelopathy or radiculopathy, lumbosacral region: Secondary | ICD-10-CM

## 2021-03-14 DIAGNOSIS — M1990 Unspecified osteoarthritis, unspecified site: Secondary | ICD-10-CM

## 2021-03-14 DIAGNOSIS — G629 Polyneuropathy, unspecified: Secondary | ICD-10-CM | POA: Insufficient documentation

## 2021-03-14 DIAGNOSIS — R300 Dysuria: Secondary | ICD-10-CM

## 2021-03-14 DIAGNOSIS — I872 Venous insufficiency (chronic) (peripheral): Secondary | ICD-10-CM

## 2021-03-14 DIAGNOSIS — D510 Vitamin B12 deficiency anemia due to intrinsic factor deficiency: Secondary | ICD-10-CM

## 2021-03-14 DIAGNOSIS — E785 Hyperlipidemia, unspecified: Secondary | ICD-10-CM

## 2021-03-14 DIAGNOSIS — Z79899 Other long term (current) drug therapy: Secondary | ICD-10-CM

## 2021-03-14 DIAGNOSIS — E039 Hypothyroidism, unspecified: Secondary | ICD-10-CM

## 2021-03-14 HISTORY — DX: Hypothyroidism, unspecified: E03.9

## 2021-03-14 HISTORY — DX: Insomnia, unspecified: G47.00

## 2021-03-14 HISTORY — DX: Polyneuropathy, unspecified: G62.9

## 2021-03-14 HISTORY — DX: Constipation, unspecified: K59.00

## 2021-03-14 HISTORY — DX: Inflammatory polyarthropathy: M06.4

## 2021-03-14 HISTORY — DX: Dysuria: R30.0

## 2021-03-14 HISTORY — DX: Allergic rhinitis, unspecified: J30.9

## 2021-03-14 HISTORY — DX: Muscle weakness (generalized): M62.81

## 2021-03-14 HISTORY — DX: Unspecified osteoarthritis, unspecified site: M19.90

## 2021-03-14 HISTORY — DX: Vitamin D deficiency, unspecified: E55.9

## 2021-03-14 HISTORY — DX: Hyperlipidemia, unspecified: E78.5

## 2021-03-14 HISTORY — DX: Venous insufficiency (chronic) (peripheral): I87.2

## 2021-03-14 HISTORY — DX: Type 2 diabetes mellitus with diabetic autonomic (poly)neuropathy: E11.43

## 2021-03-14 HISTORY — DX: Other long term (current) drug therapy: Z79.899

## 2021-03-14 HISTORY — DX: Asymptomatic menopausal state: Z78.0

## 2021-03-14 HISTORY — DX: Spondylosis without myelopathy or radiculopathy, lumbosacral region: M47.817

## 2021-03-14 HISTORY — DX: Pain in unspecified knee: M25.569

## 2021-03-14 HISTORY — DX: Vitamin B12 deficiency anemia due to intrinsic factor deficiency: D51.0

## 2021-03-14 HISTORY — DX: Cellulitis of left lower limb: L03.116

## 2021-03-14 HISTORY — DX: Acquired absence of other toe(s), unspecified side: Z89.429

## 2021-03-17 DIAGNOSIS — N184 Chronic kidney disease, stage 4 (severe): Secondary | ICD-10-CM

## 2021-03-17 HISTORY — DX: Chronic kidney disease, stage 4 (severe): N18.4

## 2021-03-19 ENCOUNTER — Other Ambulatory Visit: Payer: Self-pay | Admitting: Student

## 2021-04-11 DIAGNOSIS — I509 Heart failure, unspecified: Secondary | ICD-10-CM

## 2021-04-11 HISTORY — DX: Heart failure, unspecified: I50.9

## 2021-05-31 DIAGNOSIS — Z6841 Body Mass Index (BMI) 40.0 and over, adult: Secondary | ICD-10-CM

## 2021-05-31 HISTORY — DX: Body Mass Index (BMI) 40.0 and over, adult: Z684

## 2021-05-31 HISTORY — DX: Morbid (severe) obesity due to excess calories: E66.01

## 2021-07-14 DIAGNOSIS — G4733 Obstructive sleep apnea (adult) (pediatric): Secondary | ICD-10-CM

## 2021-07-14 DIAGNOSIS — N186 End stage renal disease: Secondary | ICD-10-CM

## 2021-07-14 DIAGNOSIS — M069 Rheumatoid arthritis, unspecified: Secondary | ICD-10-CM

## 2021-07-14 DIAGNOSIS — E119 Type 2 diabetes mellitus without complications: Secondary | ICD-10-CM | POA: Insufficient documentation

## 2021-07-14 HISTORY — DX: End stage renal disease: N18.6

## 2021-07-14 HISTORY — DX: Rheumatoid arthritis, unspecified: M06.9

## 2021-07-14 HISTORY — DX: Obstructive sleep apnea (adult) (pediatric): G47.33

## 2021-07-14 HISTORY — DX: Type 2 diabetes mellitus without complications: E11.9

## 2021-07-31 ENCOUNTER — Encounter: Payer: Self-pay | Admitting: Internal Medicine

## 2021-08-06 ENCOUNTER — Other Ambulatory Visit: Payer: Self-pay

## 2021-08-06 DIAGNOSIS — R1312 Dysphagia, oropharyngeal phase: Secondary | ICD-10-CM | POA: Insufficient documentation

## 2021-08-06 HISTORY — DX: Dysphagia, oropharyngeal phase: R13.12

## 2021-08-07 ENCOUNTER — Other Ambulatory Visit: Payer: Self-pay

## 2021-09-20 ENCOUNTER — Encounter: Payer: Self-pay | Admitting: *Deleted

## 2021-09-24 ENCOUNTER — Ambulatory Visit: Payer: Medicare Other | Admitting: Cardiology

## 2021-09-24 ENCOUNTER — Encounter: Payer: Self-pay | Admitting: Cardiology

## 2021-12-03 IMAGING — DX DG CHEST 1V PORT
1 series · 1 of 1 positions shown · non-contrast
Comparison: December 23, 2019

CLINICAL DATA: Central catheter placement

EXAM:
PORTABLE CHEST 1 VIEW

[chest ap]
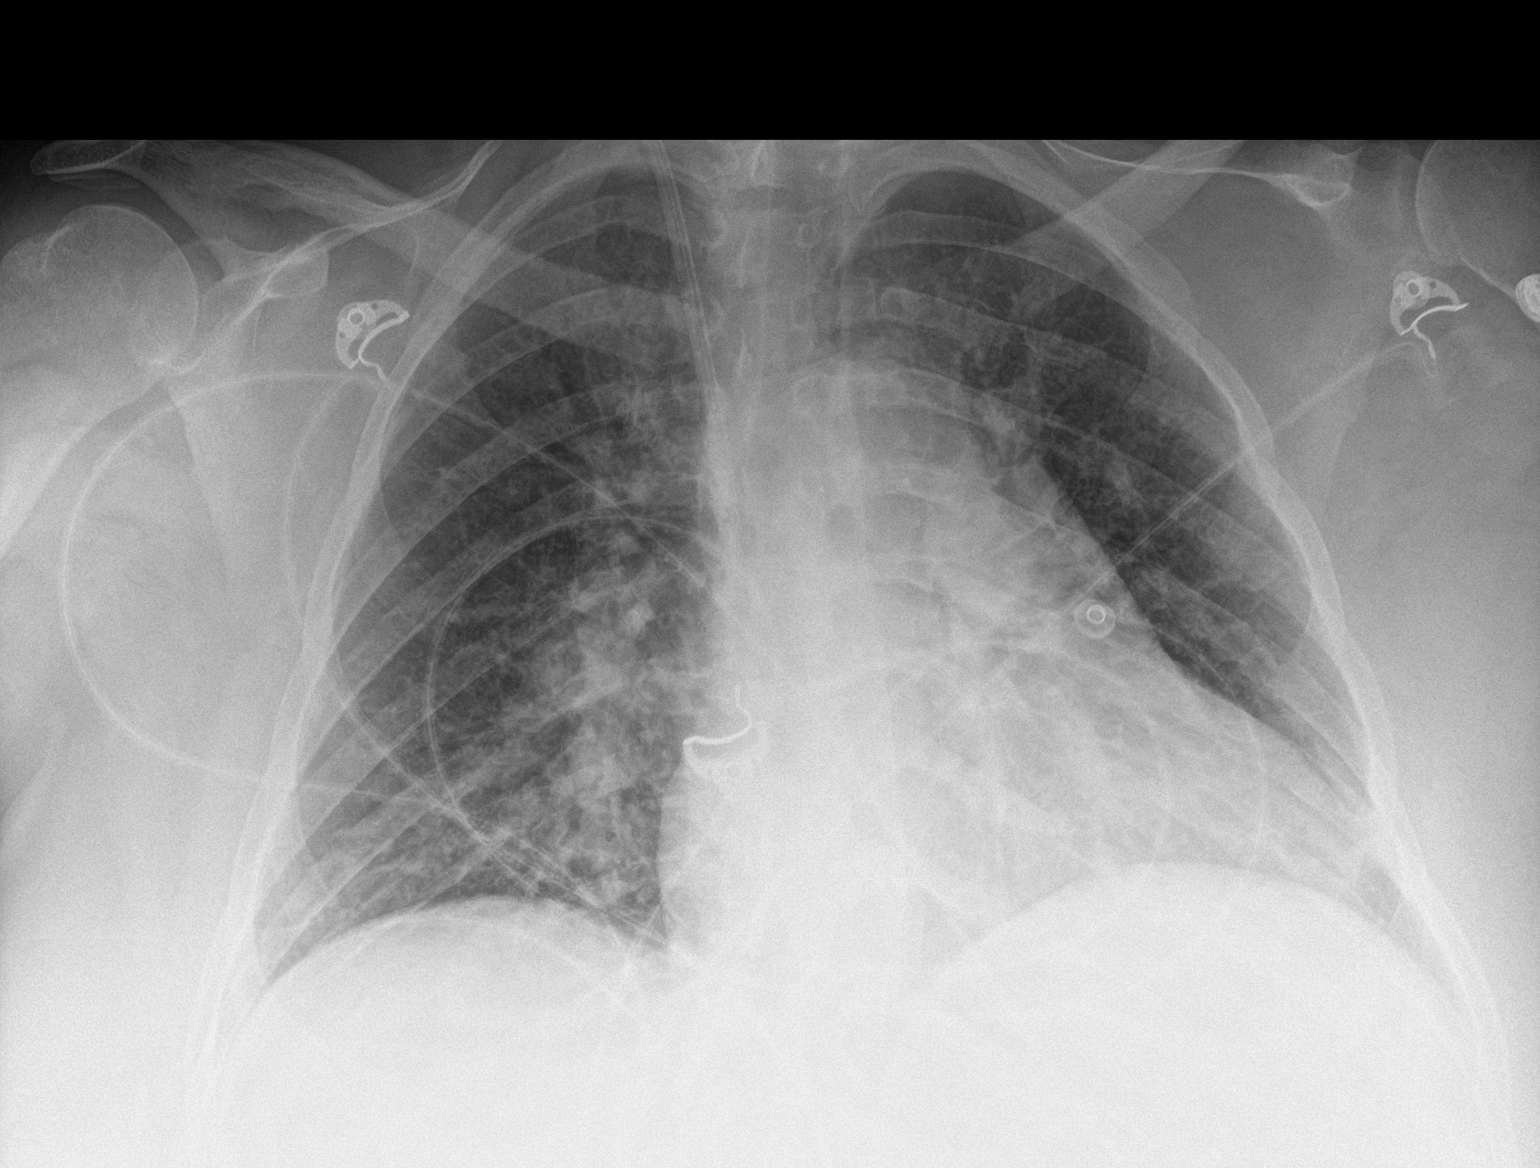

[1 of 1 positions shown; findings below may reference images not displayed]

FINDINGS: Central catheter tip is in the superior vena cava near the
cavoatrial junction. No pneumothorax. There is no appreciable edema
or airspace opacity. Heart is mildly enlarged with pulmonary
vascularity normal. No adenopathy. No bone lesions.
IMPRESSION: Central catheter as described without pneumothorax. No edema or
airspace opacity. Stable cardiac prominence.

## 2023-03-03 DIAGNOSIS — I517 Cardiomegaly: Secondary | ICD-10-CM | POA: Diagnosis not present

## 2023-04-24 DIAGNOSIS — I493 Ventricular premature depolarization: Secondary | ICD-10-CM | POA: Diagnosis not present

## 2023-04-24 DIAGNOSIS — N186 End stage renal disease: Secondary | ICD-10-CM | POA: Diagnosis not present

## 2023-04-24 DIAGNOSIS — E1351 Other specified diabetes mellitus with diabetic peripheral angiopathy without gangrene: Secondary | ICD-10-CM | POA: Diagnosis not present

## 2023-04-24 DIAGNOSIS — R55 Syncope and collapse: Secondary | ICD-10-CM | POA: Diagnosis not present

## 2023-04-25 DIAGNOSIS — R55 Syncope and collapse: Secondary | ICD-10-CM | POA: Diagnosis not present

## 2023-04-25 DIAGNOSIS — N186 End stage renal disease: Secondary | ICD-10-CM | POA: Diagnosis not present

## 2023-04-25 DIAGNOSIS — E1351 Other specified diabetes mellitus with diabetic peripheral angiopathy without gangrene: Secondary | ICD-10-CM | POA: Diagnosis not present

## 2023-04-25 DIAGNOSIS — I493 Ventricular premature depolarization: Secondary | ICD-10-CM | POA: Diagnosis not present

## 2023-04-26 DIAGNOSIS — E1351 Other specified diabetes mellitus with diabetic peripheral angiopathy without gangrene: Secondary | ICD-10-CM | POA: Diagnosis not present

## 2023-04-26 DIAGNOSIS — R55 Syncope and collapse: Secondary | ICD-10-CM | POA: Diagnosis not present

## 2023-04-26 DIAGNOSIS — I493 Ventricular premature depolarization: Secondary | ICD-10-CM | POA: Diagnosis not present

## 2023-04-26 DIAGNOSIS — N186 End stage renal disease: Secondary | ICD-10-CM | POA: Diagnosis not present

## 2023-04-27 DIAGNOSIS — Z794 Long term (current) use of insulin: Secondary | ICD-10-CM

## 2023-04-27 DIAGNOSIS — R55 Syncope and collapse: Secondary | ICD-10-CM | POA: Diagnosis not present

## 2023-04-27 DIAGNOSIS — E1351 Other specified diabetes mellitus with diabetic peripheral angiopathy without gangrene: Secondary | ICD-10-CM | POA: Diagnosis not present

## 2023-04-27 DIAGNOSIS — N186 End stage renal disease: Secondary | ICD-10-CM | POA: Diagnosis not present

## 2023-05-06 ENCOUNTER — Encounter: Payer: Self-pay | Admitting: Internal Medicine
